# Patient Record
Sex: Female | Born: 1975 | Race: White | Hispanic: No | Marital: Married | Smoking: Never smoker
Health system: Southern US, Community
[De-identification: ages and names within clinical notes are randomized; demographics above are authoritative.]

## PROBLEM LIST (undated history)

## (undated) DIAGNOSIS — F1910 Other psychoactive substance abuse, uncomplicated: Secondary | ICD-10-CM

## (undated) DIAGNOSIS — F419 Anxiety disorder, unspecified: Secondary | ICD-10-CM

## (undated) DIAGNOSIS — F32A Depression, unspecified: Secondary | ICD-10-CM

## (undated) DIAGNOSIS — D649 Anemia, unspecified: Secondary | ICD-10-CM

## (undated) DIAGNOSIS — J45909 Unspecified asthma, uncomplicated: Secondary | ICD-10-CM

## (undated) DIAGNOSIS — L409 Psoriasis, unspecified: Secondary | ICD-10-CM

## (undated) HISTORY — DX: Unspecified asthma, uncomplicated: J45.909

## (undated) HISTORY — PX: OTHER SURGICAL HISTORY: SHX169

## (undated) HISTORY — DX: Psoriasis, unspecified: L40.9

## (undated) HISTORY — DX: Anemia, unspecified: D64.9

## (undated) HISTORY — DX: Depression, unspecified: F32.A

## (undated) HISTORY — DX: Anxiety disorder, unspecified: F41.9

## (undated) HISTORY — DX: Other psychoactive substance abuse, uncomplicated: F19.10

## (undated) HISTORY — PX: TONSILLECTOMY AND ADENOIDECTOMY: SHX28

---

## 2019-10-06 ENCOUNTER — Encounter: Payer: Self-pay | Admitting: Neurology

## 2019-11-17 ENCOUNTER — Ambulatory Visit: Payer: Commercial Managed Care - PPO | Admitting: Physician Assistant

## 2019-11-17 ENCOUNTER — Encounter (INDEPENDENT_AMBULATORY_CARE_PROVIDER_SITE_OTHER): Payer: Self-pay

## 2019-11-17 ENCOUNTER — Encounter: Payer: Self-pay | Admitting: Physician Assistant

## 2019-11-17 ENCOUNTER — Other Ambulatory Visit: Payer: Self-pay

## 2019-11-17 DIAGNOSIS — L4050 Arthropathic psoriasis, unspecified: Secondary | ICD-10-CM | POA: Diagnosis not present

## 2019-11-17 DIAGNOSIS — L409 Psoriasis, unspecified: Secondary | ICD-10-CM | POA: Diagnosis not present

## 2019-11-17 MED ORDER — CLOBETASOL PROPIONATE 0.05 % EX OINT: 1.0000 "application " | TOPICAL_OINTMENT | Freq: Two times a day (BID) | CUTANEOUS | 2 refills | Status: DC

## 2019-11-17 MED ORDER — ALCLOMETASONE DIPROPIONATE 0.05 % EX CREA: TOPICAL_CREAM | Freq: Two times a day (BID) | CUTANEOUS | 3 refills | Status: DC | PRN

## 2019-11-17 MED ORDER — CLOBETASOL PROPIONATE 0.05 % EX SOLN: 1.0000 "application " | Freq: Two times a day (BID) | CUTANEOUS | 3 refills | Status: DC

## 2019-11-17 NOTE — Progress Notes (Signed)
   New Patient   Subjective  Mackenzie Mccall is a 44 y.o. female who presents for the following: Psoriasis (All over scalp, arms, legs, private areas butt crack and top of pubic area. Tx steroid cream but doesnt remember names. Also has severe joint pain with it. ).   The following portions of the chart were reviewed this encounter and updated as appropriate: Tobacco  Allergies  Meds  Problems  Med Hx  Surg Hx  Fam Hx      Objective  Well appearing patient in no apparent distress; mood and affect are within normal limits.  A full examination was performed including scalp, head, eyes, ears, nose, lips, neck, chest, axillae, abdomen, back, buttocks, bilateral upper extremities, bilateral lower extremities, hands, feet, fingers, toes, fingernails, and toenails. All findings within normal limits unless otherwise noted below.  Objective  Head - Anterior (Face), Left Elbow - Posterior, Left Forearm - Posterior, Left Hand - Posterior, Left Knee - Anterior, Left Lower Leg - Anterior (3), Pubic, Right Elbow - Posterior, Right Forearm - Posterior, Right Hand - Posterior, Right Lower Leg - Anterior (4), Right Thigh - Anterior, Right Wrist - Posterior: Well-marginated erythematous papules/plaques with silvery scale. Thick on knees. Had it on her labia in the past but it is clear now. Fingernails and toenails involved yellow discoloration and brittle.  Images          Objective  Left 3rd Dorsal Mid Toe, Left Ankle - Anterior, Left Hand - Posterior, Left Knee - Anterior, Left Lower Leg - Posterior, Left Parotid Area, Left Superior Helix, Right 3rd Proximal Dorsal Toe, Right Ankle - Anterior, Right Ankle - Posterior, Right Hand - Posterior, Right Knee - Anterior, Right Parietal Scalp: Joint pain for 22 years. No tenosynovitis. No dactylitis.  Assessment & Plan  Psoriasis (18) Head - Anterior (Face); Left Elbow - Posterior; Right Elbow - Posterior; Right Wrist - Posterior; Left Hand -  Posterior; Right Hand - Posterior; Left Knee - Anterior; Left Forearm - Posterior; Right Forearm - Posterior; Right Thigh - Anterior; Left Lower Leg - Anterior (3); Right Lower Leg - Anterior (4); Pubic  Start approval for Talz  alclomethasone (ACLOVATE) 0.05 % cream - Head - Anterior (Face)  clobetasol ointment (TEMOVATE) 0.05 % - Left Elbow - Posterior, Left Forearm - Posterior, Left Hand - Posterior, Left Knee - Anterior, Left Lower Leg - Anterior (3), Pubic, Right Elbow - Posterior, Right Forearm - Posterior, Right Hand - Posterior, Right Lower Leg - Anterior (4), Right Thigh - Anterior, Right Wrist - Posterior  Other Related Procedures Comprehensive metabolic panel CBC with Differential/Platelet Hepatitis B surface antibody,quantitative Hepatitis B surface antigen Hepatitis B core antibody, total Hepatitis C antibody QuantiFERON-TB Gold Plus ANA  Ordered Medications: clobetasol (TEMOVATE) 0.05 % external solution  Psoriatic arthritis (HCC) (13) Left Hand - Posterior; Right Hand - Posterior; Left Knee - Anterior; Right Knee - Anterior; Left Ankle - Anterior; Right Ankle - Anterior; Right Ankle - Posterior; Left Lower Leg - Posterior; Left Superior Helix; Right 3rd Proximal Dorsal Toe; Left 3rd Dorsal Mid Toe; Right Parietal Scalp; Left Parotid Area  Other Related Procedures Comprehensive metabolic panel CBC with Differential/Platelet Hepatitis B surface antibody,quantitative Hepatitis B surface antigen Hepatitis B core antibody, total Hepatitis C antibody QuantiFERON-TB Gold Plus ANA

## 2019-11-24 ENCOUNTER — Telehealth: Payer: Self-pay | Admitting: *Deleted

## 2019-11-24 NOTE — Telephone Encounter (Signed)
Faxed over [name] paper work to [employer] at [phone] and to taltz together at [phone].

## 2019-11-25 LAB — CBC WITH DIFFERENTIAL/PLATELET
Absolute Monocytes: 270 cells/uL (ref 200–950)
Basophils Absolute: 42 cells/uL (ref 0–200)
Basophils Relative: 0.8 %
Eosinophils Absolute: 250 cells/uL (ref 15–500)
Eosinophils Relative: 4.8 %
HCT: 32 % — ABNORMAL LOW (ref 35.0–45.0)
Hemoglobin: 10.3 g/dL — ABNORMAL LOW (ref 11.7–15.5)
Lymphs Abs: 1440 cells/uL (ref 850–3900)
MCH: 28.8 pg (ref 27.0–33.0)
MCHC: 32.2 g/dL (ref 32.0–36.0)
MCV: 89.4 fL (ref 80.0–100.0)
MPV: 11.1 fL (ref 7.5–12.5)
Monocytes Relative: 5.2 %
Neutro Abs: 3198 cells/uL (ref 1500–7800)
Neutrophils Relative %: 61.5 %
Platelets: 215 10*3/uL (ref 140–400)
RBC: 3.58 10*6/uL — ABNORMAL LOW (ref 3.80–5.10)
RDW: 14 % (ref 11.0–15.0)
Total Lymphocyte: 27.7 %
WBC: 5.2 10*3/uL (ref 3.8–10.8)

## 2019-11-25 LAB — ANA: Anti Nuclear Antibody (ANA): NEGATIVE

## 2019-11-25 LAB — QUANTIFERON-TB GOLD PLUS
Mitogen-NIL: >10 IU/mL
NIL: 0.02 IU/mL
QuantiFERON-TB Gold Plus: NEGATIVE
TB1-NIL: 0 IU/mL
TB2-NIL: 0 IU/mL

## 2019-11-25 LAB — COMPREHENSIVE METABOLIC PANEL
AG Ratio: 1.9 (calc) (ref 1.0–2.5)
ALT: 17 U/L (ref 6–29)
AST: 15 U/L (ref 10–30)
Albumin: 4.3 g/dL (ref 3.6–5.1)
Alkaline phosphatase (APISO): 78 U/L (ref 31–125)
BUN: 20 mg/dL (ref 7–25)
CO2: 24 mmol/L (ref 20–32)
Calcium: 9.1 mg/dL (ref 8.6–10.2)
Chloride: 106 mmol/L (ref 98–110)
Creat: 0.57 mg/dL (ref 0.50–1.10)
Globulin: 2.3 g/dL (calc) (ref 1.9–3.7)
Glucose, Bld: 93 mg/dL (ref 65–99)
Potassium: 4.4 mmol/L (ref 3.5–5.3)
Sodium: 140 mmol/L (ref 135–146)
Total Bilirubin: 0.4 mg/dL (ref 0.2–1.2)
Total Protein: 6.6 g/dL (ref 6.1–8.1)

## 2019-11-25 LAB — HEPATITIS B SURFACE ANTIBODY, QUANTITATIVE: Hep B S AB Quant (Post): 26 m[IU]/mL (ref >=10)

## 2019-11-25 LAB — HEPATITIS C ANTIBODY
Hepatitis C Ab: NONREACTIVE
SIGNAL TO CUT-OFF: 0.01 (ref <1.00)

## 2019-11-25 LAB — HEPATITIS B CORE ANTIBODY, TOTAL: Hep B Core Total Ab: NONREACTIVE

## 2019-11-25 LAB — HEPATITIS B SURFACE ANTIGEN: Hepatitis B Surface Ag: NONREACTIVE

## 2019-11-30 ENCOUNTER — Telehealth: Payer: Self-pay | Admitting: Physician Assistant

## 2019-11-30 NOTE — Telephone Encounter (Signed)
Blood work results

## 2019-11-30 NOTE — Telephone Encounter (Signed)
Labs to patient told her we are working on getting her biologic taltz approved. Will be in tough when we hear something.

## 2019-12-03 ENCOUNTER — Telehealth: Payer: Self-pay | Admitting: Physician Assistant

## 2019-12-03 NOTE — Telephone Encounter (Signed)
Left message for patient to call us back.  

## 2019-12-03 NOTE — Telephone Encounter (Signed)
Patient left message on office voice mail saying that she had just received a denial on Taltz and wants to know what the next step is.   (Epic)

## 2019-12-07 ENCOUNTER — Other Ambulatory Visit: Payer: Self-pay | Admitting: Physician Assistant

## 2019-12-07 NOTE — Telephone Encounter (Signed)
Message left for patient, she  don't need to do anything we are filing a appeal.

## 2019-12-07 NOTE — Telephone Encounter (Signed)
Patient left message on office voice mail saying that she was calling again about the denial she received concerning prescription.  Patient wants to know what she needs to do to get prescription filled.

## 2019-12-08 NOTE — Telephone Encounter (Signed)
° ° °  Patient aware due to insurance medication will be changed to Cimzia per [facility]. [facility] portal updated new rx sent

## 2019-12-10 ENCOUNTER — Telehealth: Payer: Self-pay | Admitting: Physician Assistant

## 2019-12-10 NOTE — Telephone Encounter (Signed)
Mackenzie Mccall with [name] left message on office voice mail saying that she needed to speak with someone concerning trhe enrollment form that they received on [employer].  The program number that needs to be referenced is [id number].  Mackenzie Mccall said she needed to speak with someone as soon as possible.   (Epic)

## 2019-12-10 NOTE — Telephone Encounter (Signed)
PATIENT DINED TALTZ BY INSURANCE NOW ON CIMZIA

## 2019-12-11 MED ORDER — CIMZIA PREFILLED 2 X 200 MG/ML ~~LOC~~ KIT
400.0000 mg | PACK | SUBCUTANEOUS | 3 refills | Status: DC
Start: 2019-12-11 — End: 2019-12-14

## 2019-12-11 NOTE — Addendum Note (Signed)
Addended by: Johnna Acosta on: 12/11/2019 01:18 PM   Modules accepted: Orders

## 2019-12-11 NOTE — Addendum Note (Signed)
Addended by: Johnna Acosta on: 12/11/2019 12:12 PM   Modules accepted: Orders

## 2019-12-11 NOTE — Telephone Encounter (Signed)
   This was done in epic

## 2019-12-14 MED ORDER — CIMZIA PREFILLED 2 X 200 MG/ML ~~LOC~~ KIT: 400.0000 mg | PACK | SUBCUTANEOUS | 3 refills | Status: DC

## 2019-12-14 NOTE — Addendum Note (Signed)
Addended by: Johnna Acosta on: 12/14/2019 11:59 AM   Modules accepted: Orders

## 2019-12-14 NOTE — Telephone Encounter (Signed)
[facility] needed maintenance dose submitted

## 2019-12-23 ENCOUNTER — Telehealth: Payer: Self-pay | Admitting: *Deleted

## 2019-12-23 NOTE — Telephone Encounter (Signed)
According to [facility] patient already got delivery of Cimzia: I left message with patient to confirm she has received and is taking Cimzia

## 2019-12-23 NOTE — Telephone Encounter (Signed)
Patient called back and confirmed she has received Cimzia but is afraid to give to self because she hasn't been shown.  Encouraged patient to make nurse to see visit and we will teach her how to administer.  Nurse visit made tomorrow at 11 oclock.

## 2019-12-24 ENCOUNTER — Other Ambulatory Visit: Payer: Self-pay

## 2019-12-24 ENCOUNTER — Ambulatory Visit (INDEPENDENT_AMBULATORY_CARE_PROVIDER_SITE_OTHER): Payer: Commercial Managed Care - PPO | Admitting: *Deleted

## 2019-12-24 DIAGNOSIS — L409 Psoriasis, unspecified: Secondary | ICD-10-CM

## 2019-12-24 MED ORDER — CERTOLIZUMAB PEGOL 2 X 200 MG/ML ~~LOC~~ KIT
400.0000 mg | PACK | Freq: Once | SUBCUTANEOUS | Status: AC
  Administered 2019-12-24: 400 mg via SUBCUTANEOUS

## 2019-12-28 NOTE — Progress Notes (Signed)
NEUROLOGY CONSULTATION NOTE  Mackenzie Mccall MRN: [id number] DOB: Aug 10, 1975  Referring provider: Donald Prose, MD Primary care provider: Donald Prose, MD  Reason for consult:  Possible transient ischemic attack  HISTORY OF PRESENT ILLNESS: Mackenzie Mccall is a 44 year old right-handed female with psoriasis who presents for possible TIA.  History supplemented by referring provider's note.  In October, she had an episode where she developed bilateral blurred vision and weakness of her right arm and leg.  She had to drag herself upstairs and then fell.  She went to the bathroom to splash water on her face and then she felt lightheaded and passed out, but only for a few seconds.  Her husband came into the bathroom.  She had not bit her tongue or had incontinence.  She looked at herself in the mirror and noted that her face was pale and lips blue.  She washed her face and felt better.  She felt well that day and was not dehydrated or missed meals.  For the next couple of weeks, she felt some vague weakness in the right lower extremity and some word-finding difficulty.  Since then, she still has very mild word-finding difficulty and feels like she is in a fog.  She did not seek medical attention at the time.  She did not have COVID.  No prior history and no subsequent spells.  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  Her mother has history of epilepsy.  No family history of stroke at young age.    Mom epilepsy Seizure codeine after operation.    PAST MEDICAL HISTORY: Past Medical History:  Diagnosis Date  . Psoriasis     PAST SURGICAL HISTORY: No past surgical history on file.  MEDICATIONS: Current Outpatient Medications on File Prior to Visit  Medication Sig Dispense Refill  . alclomethasone (ACLOVATE) 0.05 % cream Apply topically 2 (two) times daily as needed (Rash). Apply to face as directed. 180 g 3  . Certolizumab Pegol (CIMZIA PREFILLED) 2 X 200 MG/ML KIT Inject 400 mg  into the skin every 14 (fourteen) days. 2 kit 3  . clobetasol (TEMOVATE) 0.05 % external solution Apply 1 application topically 2 (two) times daily. 50 mL 3  . clobetasol ointment (TEMOVATE) 5.62 % Apply 1 application topically 2 (two) times daily. 60 g 2  . Multiple Vitamin (MULTIVITAMIN) tablet Take 1 tablet by mouth daily.     No current facility-administered medications on file prior to visit.    ALLERGIES: Allergies  Allergen Reactions  . Codeine   . Latex   . Penicillins     FAMILY HISTORY: History reviewed. No pertinent family history.  SOCIAL HISTORY: Social History   Socioeconomic History  . Marital status: Married    Spouse name: Not on file  . Number of children: Not on file  . Years of education: Not on file  . Highest education level: Not on file  Occupational History  . Not on file  Tobacco Use  . Smoking status: Never Smoker  . Smokeless tobacco: Never Used  Substance and Sexual Activity  . Alcohol use: Yes  . Drug use: Never  . Sexual activity: Not on file  Other Topics Concern  . Not on file  Social History Narrative  . Not on file   Social Determinants of Health   Financial Resource Strain:   . Difficulty of Paying Living Expenses: Not on file  Food Insecurity:   . Worried About Charity fundraiser  in the Last Year: Not on file  . Ran Out of Food in the Last Year: Not on file  Transportation Needs:   . Lack of Transportation (Medical): Not on file  . Lack of Transportation (Non-Medical): Not on file  Physical Activity:   . Days of Exercise per Week: Not on file  . Minutes of Exercise per Session: Not on file  Stress:   . Feeling of Stress : Not on file  Social Connections:   . Frequency of Communication with Friends and Family: Not on file  . Frequency of Social Gatherings with Friends and Family: Not on file  . Attends Religious Services: Not on file  . Active Member of Clubs or Organizations: Not on file  . Attends Archivist  Meetings: Not on file  . Marital Status: Not on file  Intimate Partner Violence:   . Fear of Current or Ex-Partner: Not on file  . Emotionally Abused: Not on file  . Physically Abused: Not on file  . Sexually Abused: Not on file    PHYSICAL EXAM: Blood pressure 134/78, pulse 67, height '5\' 5"'  (1.651 m), weight 217 lb (98.4 kg), SpO2 97 %. General: No acute distress.  Patient appears well-groomed.  Head:  Normocephalic/atraumatic Eyes:  fundi examined but not visualized Neck: supple, no paraspinal tenderness, full range of motion Back: No paraspinal tenderness Heart: regular rate and rhythm Lungs: Clear to auscultation bilaterally. Vascular: No carotid bruits. Neurological Exam: Mental status: alert and oriented to person, place, and time, recent and remote memory intact, fund of knowledge intact, attention and concentration intact, speech fluent and not dysarthric, language intact. Cranial nerves: CN I: not tested CN II: pupils equal, round and reactive to light, visual fields intact CN III, IV, VI:  full range of motion, no nystagmus, no ptosis CN V: facial sensation intact CN VII: upper and lower face symmetric CN VIII: hearing intact CN IX, X: gag intact, uvula midline CN XI: sternocleidomastoid and trapezius muscles intact CN XII: tongue midline Bulk & Tone: normal, no fasciculations. Motor:  5/5 throughout  Sensation:  Pinprick and vibration sensation intact. Deep Tendon Reflexes:  2+ throughout, toes downgoing.  Finger to nose testing:  Without dysmetria.  Heel to shin:  Without dysmetria.  Gait:  Normal station and stride. Romberg negative.  IMPRESSION: Transient episode of syncope, blurred vision, right sided weakness, word-finding difficulty.  Etiology unclear.  She does not have stroke risk factors but would evaluate for possible vertebrobasilar event.  Consider seizure as well.  PLAN: 1.  MRI and MRA of brain without contrast 2.  Routine EEG 3.  Further  recommendations pending results.  Thank you for allowing me to take part in the care of this patient.  Metta Clines, DO  CC: Donald Prose, MD

## 2019-12-29 ENCOUNTER — Other Ambulatory Visit: Payer: Self-pay

## 2019-12-29 ENCOUNTER — Ambulatory Visit: Payer: Commercial Managed Care - PPO | Admitting: Neurology

## 2019-12-29 ENCOUNTER — Encounter: Payer: Self-pay | Admitting: Neurology

## 2019-12-29 VITALS — BP 134/78 | HR 67 | Ht 65.0 in | Wt 217.0 lb

## 2019-12-29 DIAGNOSIS — H538 Other visual disturbances: Secondary | ICD-10-CM

## 2019-12-29 DIAGNOSIS — R55 Syncope and collapse: Secondary | ICD-10-CM | POA: Diagnosis not present

## 2019-12-29 DIAGNOSIS — R4789 Other speech disturbances: Secondary | ICD-10-CM

## 2019-12-29 DIAGNOSIS — R531 Weakness: Secondary | ICD-10-CM

## 2019-12-29 NOTE — Patient Instructions (Addendum)
1.  Check MRI of brain without contrast 2.  Check MRA of head 3.  Routine EEG 4.  Further recommendations pending results

## 2020-01-13 ENCOUNTER — Other Ambulatory Visit: Payer: Self-pay

## 2020-01-13 ENCOUNTER — Ambulatory Visit (INDEPENDENT_AMBULATORY_CARE_PROVIDER_SITE_OTHER): Payer: Commercial Managed Care - PPO | Admitting: Neurology

## 2020-01-13 DIAGNOSIS — R55 Syncope and collapse: Secondary | ICD-10-CM

## 2020-01-13 NOTE — Procedures (Signed)
ELECTROENCEPHALOGRAM REPORT  Date of Study: 0915/2021  Patient's Name: Mackenzie Mccall MRN: [id number] Date of Birth: [birth year]  Clinical History: 44 year old female with transient episode of syncope, blurred vision, right sided weakness, word-finding difficulty.   Medications: ACLOVATE 0.05 % cream CIMZIA PREFILLED 2 X 200 MG/ML KIT TEMOVATE 0.05 % external solution MULTIVITAMIN tablet  Technical Summary: A multichannel digital EEG recording measured by the international 10-20 system with electrodes applied with paste and impedances below 5000 ohms performed in our laboratory with EKG monitoring in an awake and drowsy patient.  Hyperventilation was not performed as patient was wearing a face mask due to the COVID-19 pandemic.  Photic stimulation w performed.  The digital EEG was referentially recorded, reformatted, and digitally filtered in a variety of bipolar and referential montages for optimal display.    Description: The patient is awake and drowsy during the recording.  During maximal wakefulness, there is a symmetric, medium voltage 10 Hz posterior dominant rhythm that attenuates with eye opening.  The record is symmetric.  Stage 2 sleep was not seen.  Photic stimulation did not elicit any abnormalities.  There were no epileptiform discharges or electrographic seizures seen.    EKG lead was unremarkable.  Impression: This awake and drowsy EEG is normal.    Clinical Correlation: A normal EEG does not exclude a clinical diagnosis of epilepsy.  If further clinical questions remain, prolonged EEG may be helpful.  Clinical correlation is advised.   Metta Clines, DO

## 2020-01-23 ENCOUNTER — Ambulatory Visit
Admission: RE | Admit: 2020-01-23 | Discharge: 2020-01-23 | Disposition: A | Discharge status: Home / Self Care | Payer: Commercial Managed Care - PPO | Source: Ambulatory Visit | Attending: Neurology | Admitting: Neurology

## 2020-01-23 DIAGNOSIS — R531 Weakness: Secondary | ICD-10-CM

## 2020-01-23 IMAGING — MR MR HEAD W/O CM
10 series · 48 of 48 positions shown · non-contrast
Comparison: None available.

CLINICAL DATA: Follow-up examination for stroke, acute right-sided
weakness, episode of visual loss.

EXAM:
MRI HEAD WITHOUT CONTRAST
MRA HEAD WITHOUT CONTRAST
TECHNIQUE: Multiplanar, multiecho pulse sequences of the brain and surrounding
structures were obtained without intravenous contrast. Angiographic
images of the head were obtained using MRA technique without
contrast.

[Series 1: T1 · sagittal · 4.0mm · 0.75mm/px · 2 of 31 slices shown (1 of 2)]
[im 1/31]
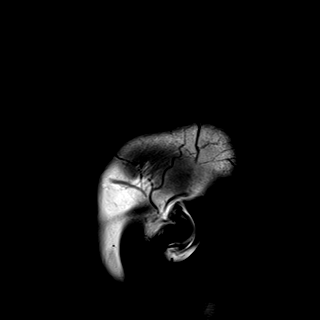
[im 31/31]
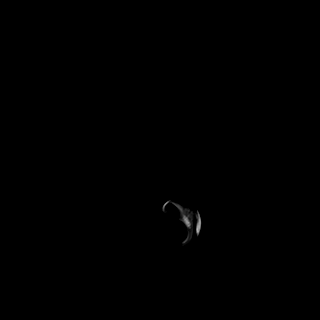

[Series 2: DWI · axial · 3.0mm · 1.44mm/px · z∈[-102,+42]mm · 7 of 90 slices shown (1 of 4)]
[im 1/90]
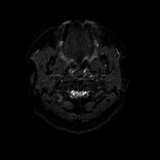
[im 15/90]
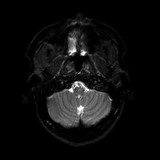
[im 30/90]
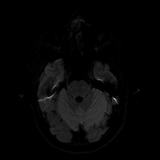
[im 45/90]
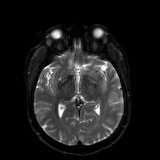
[im 60/90]
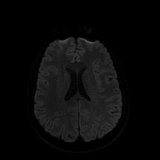
[im 75/90]
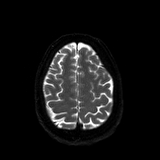
[im 90/90]
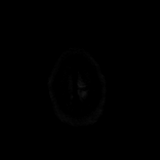

[Series 3: DWI · axial · 3.0mm · 1.44mm/px · z∈[-102,+42]mm · 4 of 45 slices shown (2 of 4)]
[im 1/45]
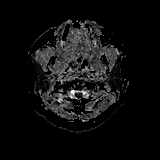
[im 15/45]
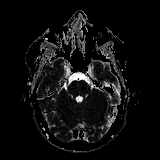
[im 30/45]
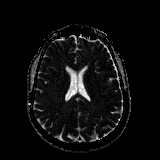
[im 45/45]
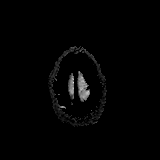

[Series 4: DWI · coronal · 5.0mm · 1.44mm/px · 5 of 60 slices shown (3 of 4)]
[im 1/60]
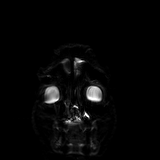
[im 15/60]
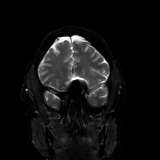
[im 30/60]
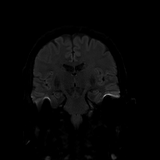
[im 45/60]
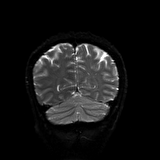
[im 60/60]
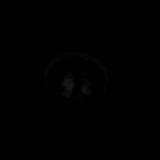

[Series 5: DWI · coronal · 5.0mm · 1.44mm/px · 2 of 30 slices shown (4 of 4)]
[im 1/30]
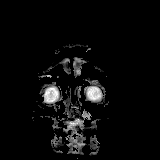
[im 30/30]
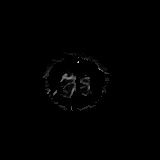

[Series 6: T2 · axial · 4.0mm · 0.36mm/px · z∈[-101,+44]mm · 2 of 29 slices shown (1 of 2)]
[im 1/29]
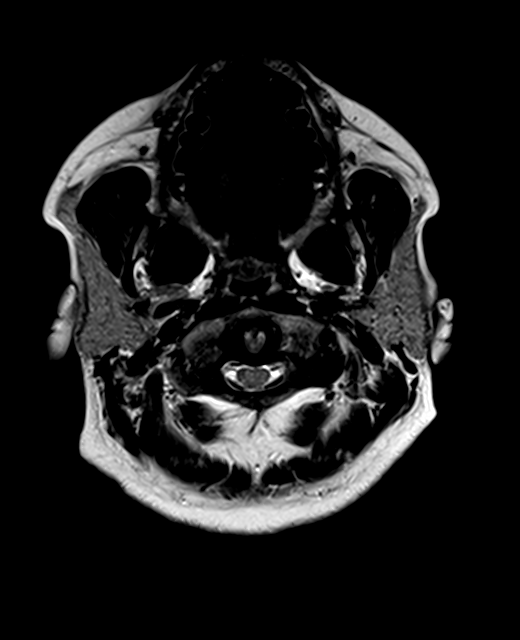
[im 29/29]
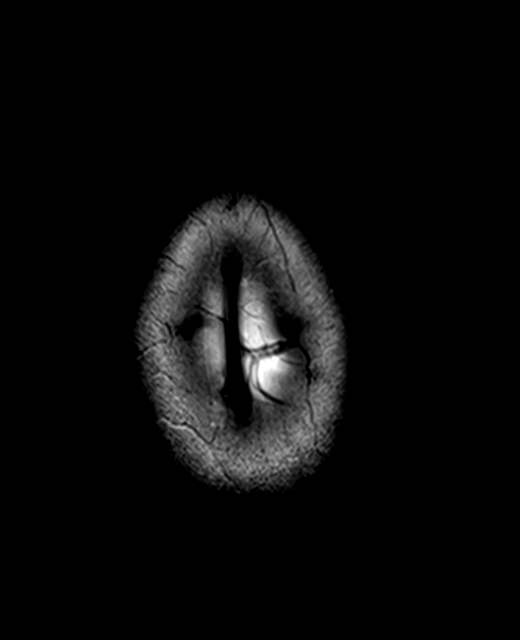

[Series 7: FLAIR · axial · 3.0mm · 0.72mm/px · z∈[-105,+44]mm · 2 of 26 slices shown]
[im 1/26]
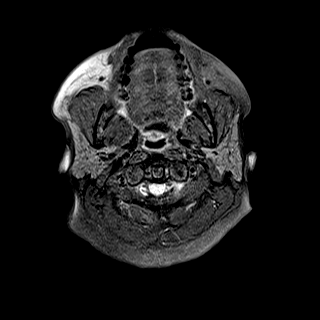
[im 26/26]
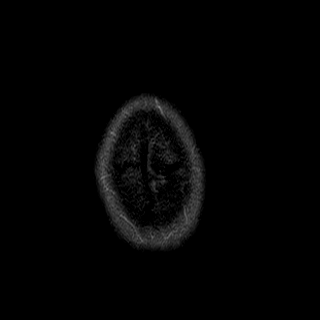

[Series 9: swi_images · axial · 1.5mm · 0.90mm/px · z∈[-99,+42]mm · 8 of 96 slices shown]
[im 1/96]
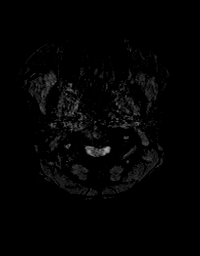
[im 14/96]
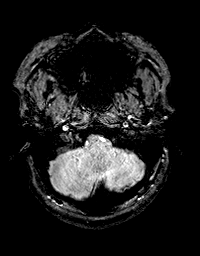
[im 28/96]
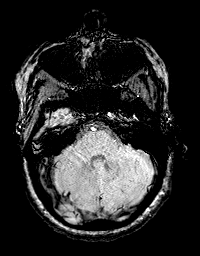
[im 41/96]
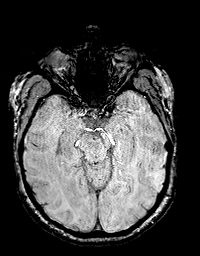
[im 55/96]
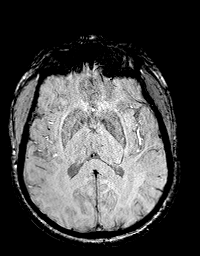
[im 68/96]
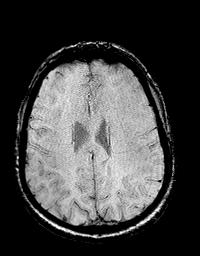
[im 82/96]
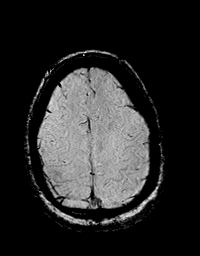
[im 96/96]
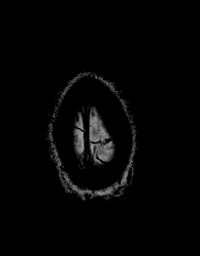

[Series 10: T1 · axial · 1.0mm · 0.94mm/px · z∈[-108,+50]mm · 13 of 160 slices shown (2 of 2)]
[im 1/160]
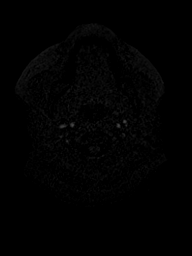
[im 14/160]
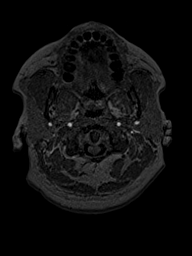
[im 27/160]
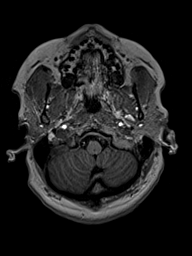
[im 40/160]
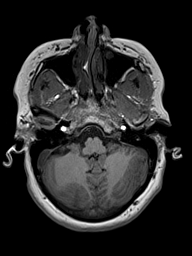
[im 54/160]
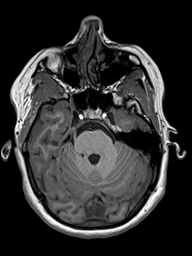
[im 67/160]
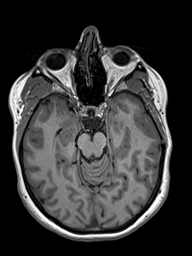
[im 80/160]
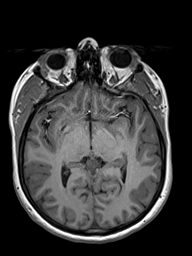
[im 93/160]
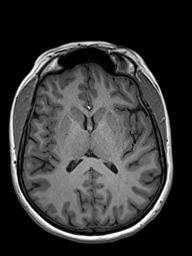
[im 107/160]
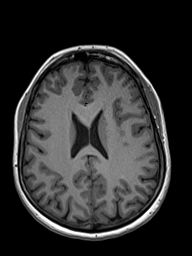
[im 120/160]
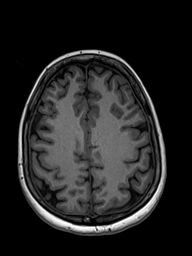
[im 133/160]
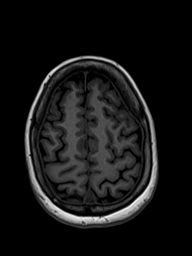
[im 146/160]
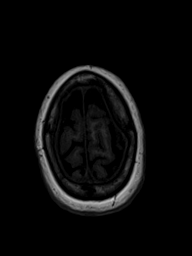
[im 160/160]
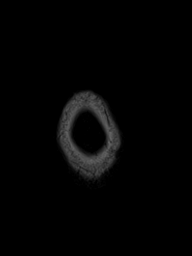

[Series 11: T2 · coronal · 4.0mm · 0.36mm/px · 3 of 35 slices shown (2 of 2)]
[im 1/35]
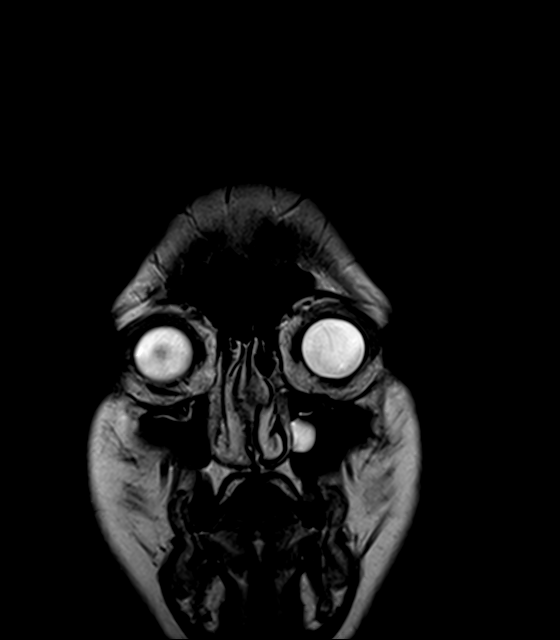
[im 18/35]
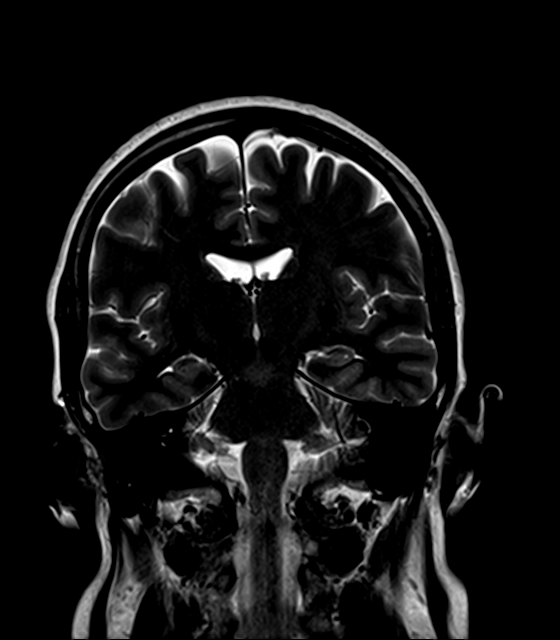
[im 35/35]
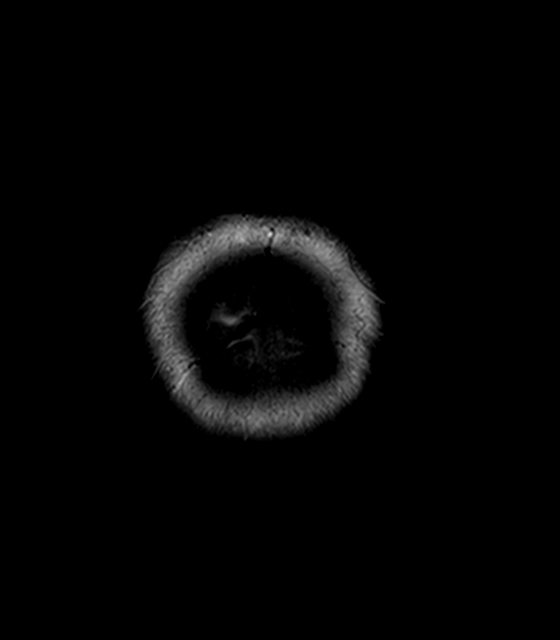

[48 of 48 positions shown; findings below may reference images not displayed]

FINDINGS: MRI HEAD FINDINGS

Brain: Cerebral volume within normal limits. No focal parenchymal
signal abnormality identified. No significant cerebral white matter
disease for age.

No abnormal foci of restricted diffusion to suggest acute or
subacute ischemia. Gray-white matter differentiation well
maintained. No encephalomalacia to suggest chronic or prior
infarction. No foci of susceptibility artifact to suggest acute or
chronic intracranial hemorrhage.

No mass lesion, midline shift or mass effect. She ventricles normal
size without hydrocephalus. No extra-axial fluid collection.
Pituitary gland suprasellar region within normal limits. Midline
structures intact.

Vascular: Major intracranial vascular flow voids are well
maintained.

Skull and upper cervical spine: Craniocervical junction within
normal limits. Visualized upper cervical spine normal. Bone marrow
signal intensity within normal limits. No scalp soft tissue
abnormality.

Sinuses/Orbits: Globes and orbital soft tissues within normal
limits. Small left maxillary sinus retention cyst noted. Paranasal
sinuses are otherwise largely clear. Left-to-right nasal septal
deviation noted without concha bullosa. No mastoid effusion. Inner
ear structures grossly normal.

Other: None.

MRA HEAD FINDINGS

ANTERIOR CIRCULATION:

Distal cervical segments of the internal carotid arteries are widely
patent with symmetric antegrade flow. Petrous, cavernous, and
supraclinoid ICAs widely patent without stenosis or other
abnormality. Origin of the ophthalmic arteries patent and normal.
ICA termini well perfused. A1 segments patent bilaterally. Left A1
slightly hypoplastic, accounting for the slightly diminutive left
ICA is compared to the right. Normal anterior communicating artery
complex. Strongly dominant right A2 segment noted. Both PCAs patent
to their distal aspects without stenosis. No M1 stenosis or
occlusion. Normal MCA bifurcations. Distal MCA branches well
perfused and symmetric.

POSTERIOR CIRCULATION:

Both vertebral arteries widely patent to the vertebrobasilar
junction without stenosis. Left vertebral artery dominant. Both
picas patent. Basilar widely patent to its distal aspect without
stenosis. Superior cerebral arteries patent bilaterally. Both PCAs
primarily supplied via the basilar well perfused to their distal
aspects.

No intracranial aneurysm or other vascular abnormality.
IMPRESSION: 1. Normal brain MRI for age. No acute, subacute, or chronic ischemic
changes identified. No other findings to explain patient's symptoms.
2. Normal intracranial MRA.

## 2020-01-23 IMAGING — MR MR MRA HEAD W/O CM
1 series · 10 of 48 positions shown · non-contrast
Comparison: None available.

CLINICAL DATA: Follow-up examination for stroke, acute right-sided
weakness, episode of visual loss.

EXAM:
MRI HEAD WITHOUT CONTRAST
MRA HEAD WITHOUT CONTRAST
TECHNIQUE: Multiplanar, multiecho pulse sequences of the brain and surrounding
structures were obtained without intravenous contrast. Angiographic
images of the head were obtained using MRA technique without
contrast.

[Series 5: [id number]-[id number] · axial · 0.6mm · 0.26mm/px · z∈[-61,+15]mm · 10 of 162 slices shown]
[im 11/162]
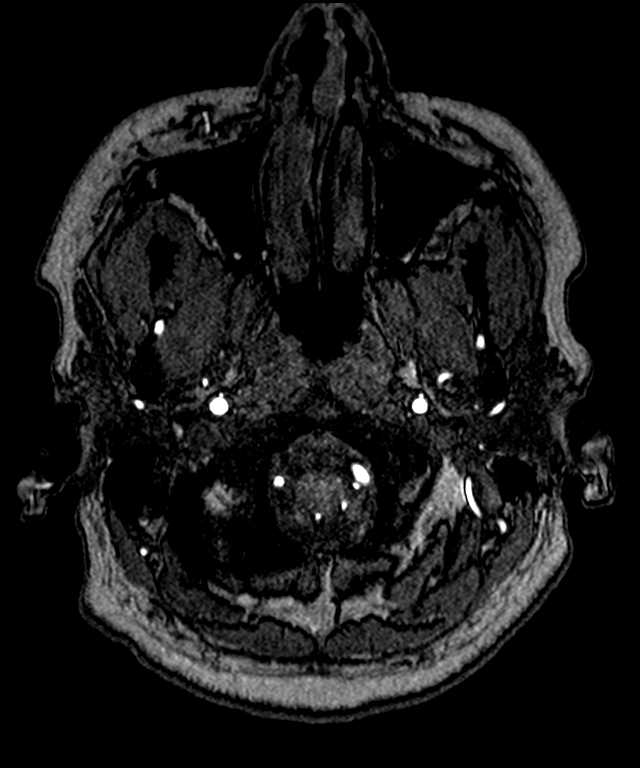
[im 28/162]
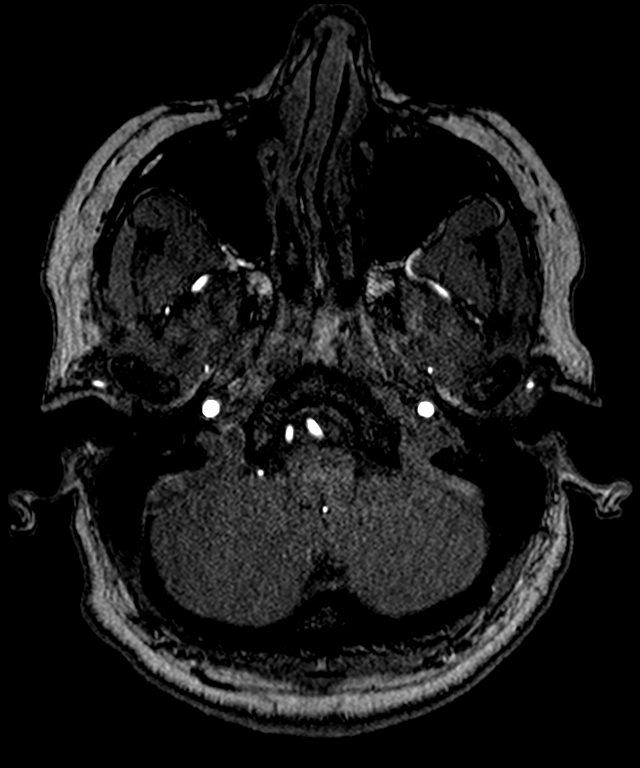
[im 31/162]
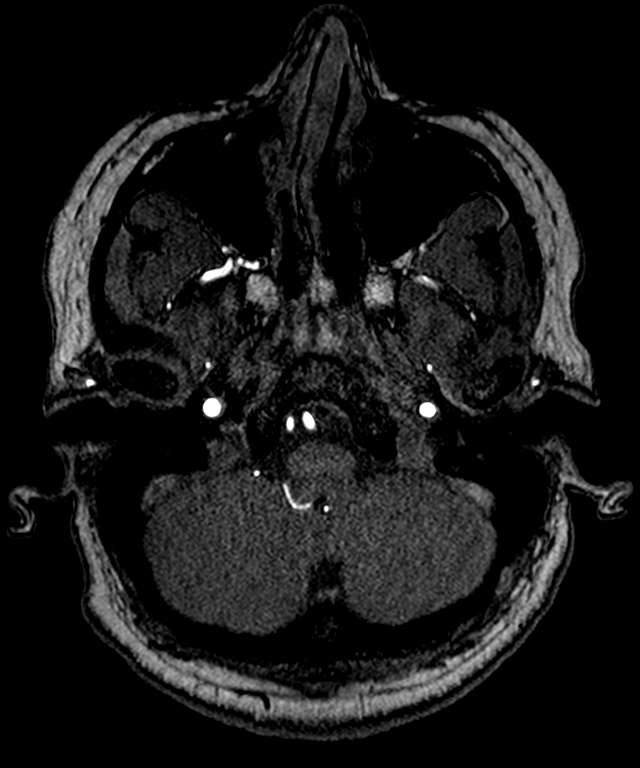
[im 52/162]
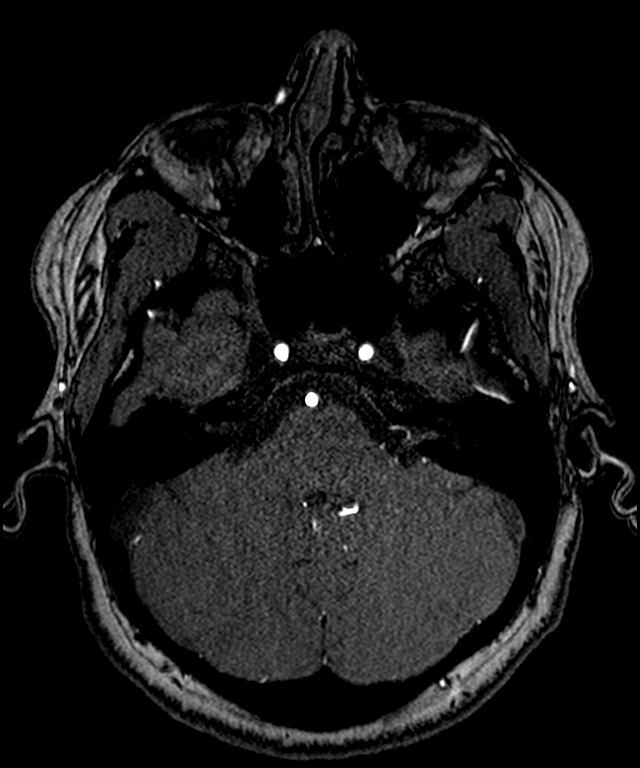
[im 72/162]
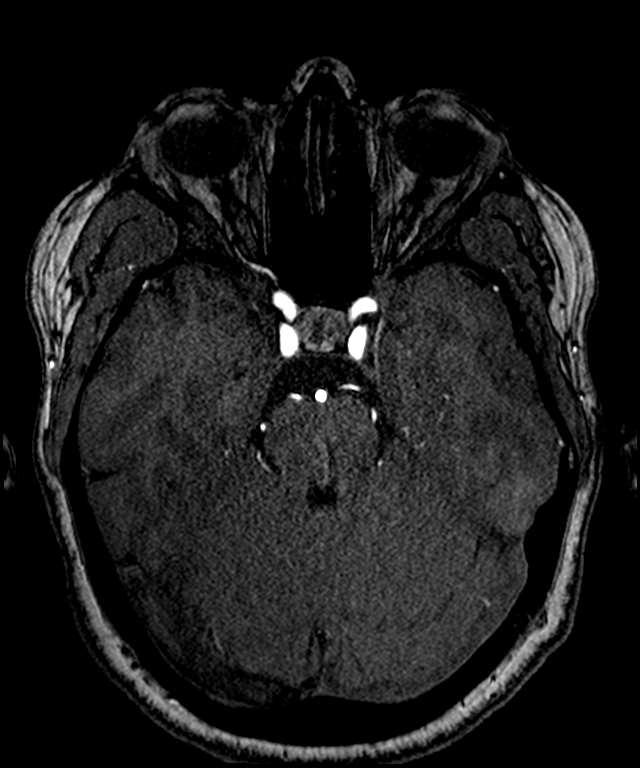
[im 83/162]
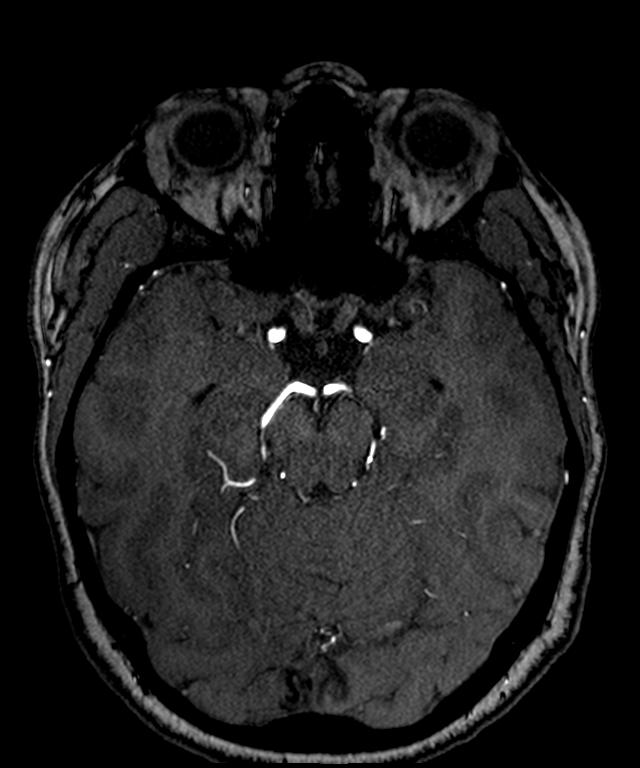
[im 93/162]
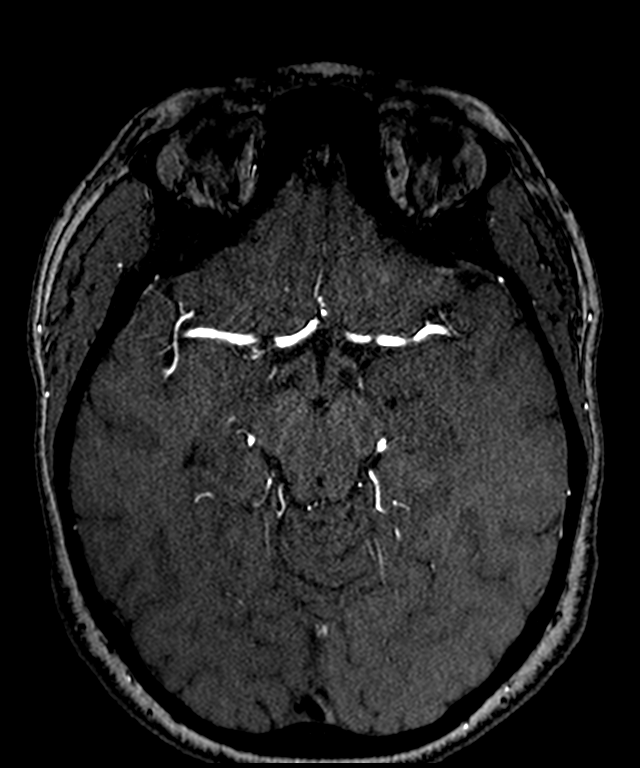
[im 114/162]
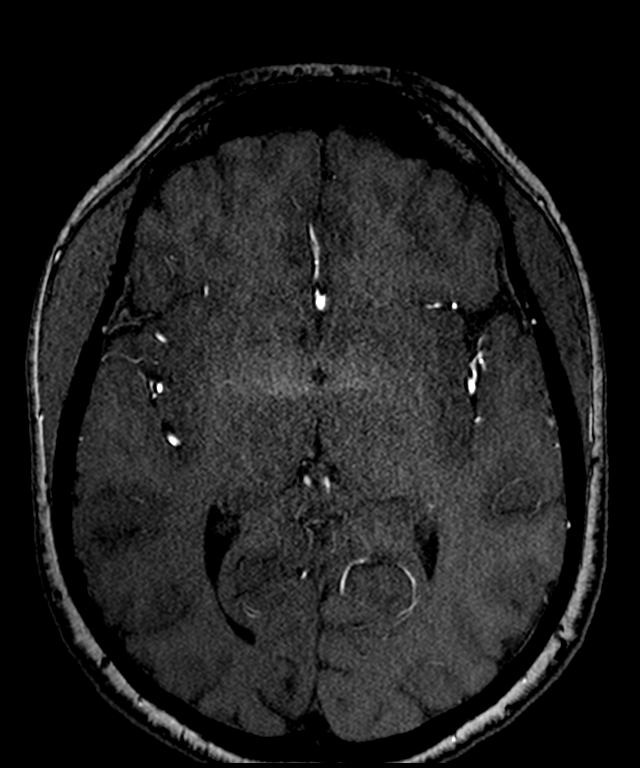
[im 134/162]
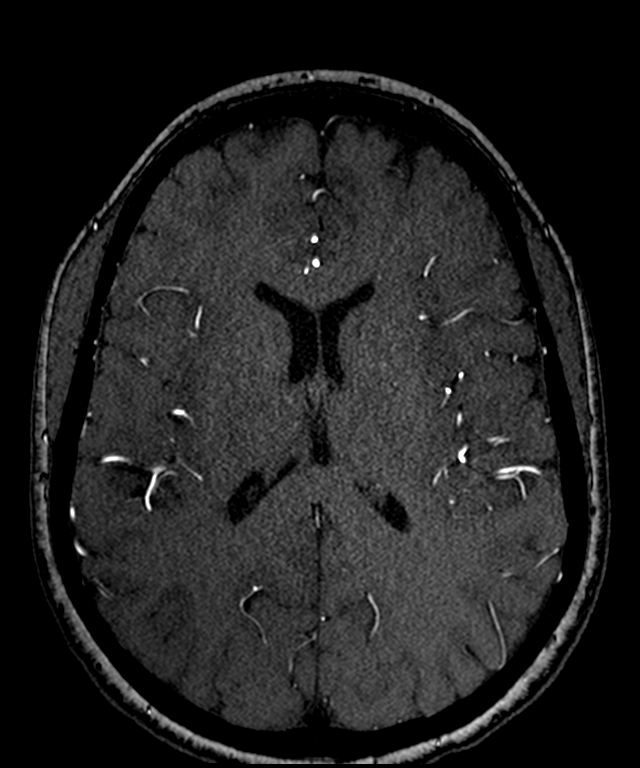
[im 138/162]
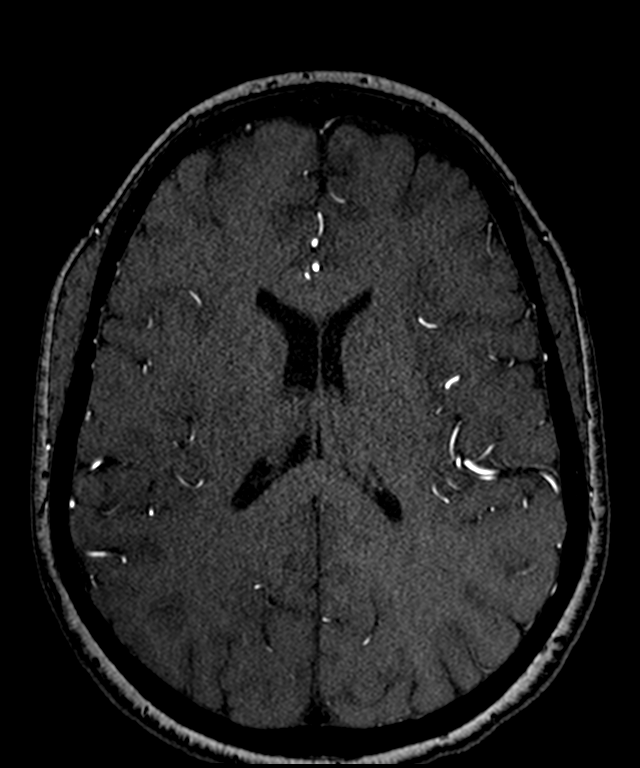

[10 of 48 positions shown; findings below may reference images not displayed]

FINDINGS: MRI HEAD FINDINGS

Brain: Cerebral volume within normal limits. No focal parenchymal
signal abnormality identified. No significant cerebral white matter
disease for age.

No abnormal foci of restricted diffusion to suggest acute or
subacute ischemia. Gray-white matter differentiation well
maintained. No encephalomalacia to suggest chronic or prior
infarction. No foci of susceptibility artifact to suggest acute or
chronic intracranial hemorrhage.

No mass lesion, midline shift or mass effect. She ventricles normal
size without hydrocephalus. No extra-axial fluid collection.
Pituitary gland suprasellar region within normal limits. Midline
structures intact.

Vascular: Major intracranial vascular flow voids are well
maintained.

Skull and upper cervical spine: Craniocervical junction within
normal limits. Visualized upper cervical spine normal. Bone marrow
signal intensity within normal limits. No scalp soft tissue
abnormality.

Sinuses/Orbits: Globes and orbital soft tissues within normal
limits. Small left maxillary sinus retention cyst noted. Paranasal
sinuses are otherwise largely clear. Left-to-right nasal septal
deviation noted without concha bullosa. No mastoid effusion. Inner
ear structures grossly normal.

Other: None.

MRA HEAD FINDINGS

ANTERIOR CIRCULATION:

Distal cervical segments of the internal carotid arteries are widely
patent with symmetric antegrade flow. Petrous, cavernous, and
supraclinoid ICAs widely patent without stenosis or other
abnormality. Origin of the ophthalmic arteries patent and normal.
ICA termini well perfused. A1 segments patent bilaterally. Left A1
slightly hypoplastic, accounting for the slightly diminutive left
ICA is compared to the right. Normal anterior communicating artery
complex. Strongly dominant right A2 segment noted. Both PCAs patent
to their distal aspects without stenosis. No M1 stenosis or
occlusion. Normal MCA bifurcations. Distal MCA branches well
perfused and symmetric.

POSTERIOR CIRCULATION:

Both vertebral arteries widely patent to the vertebrobasilar
junction without stenosis. Left vertebral artery dominant. Both
picas patent. Basilar widely patent to its distal aspect without
stenosis. Superior cerebral arteries patent bilaterally. Both PCAs
primarily supplied via the basilar well perfused to their distal
aspects.

No intracranial aneurysm or other vascular abnormality.
IMPRESSION: 1. Normal brain MRI for age. No acute, subacute, or chronic ischemic
changes identified. No other findings to explain patient's symptoms.
2. Normal intracranial MRA.

## 2020-01-25 ENCOUNTER — Telehealth: Payer: Self-pay

## 2020-01-25 NOTE — Telephone Encounter (Signed)
Patient called in stating she got a message about her MRI results.

## 2020-02-24 NOTE — Progress Notes (Signed)
Nurse to see for injection training

## 2020-03-18 ENCOUNTER — Ambulatory Visit: Payer: Commercial Managed Care - PPO | Admitting: Physician Assistant

## 2020-04-05 ENCOUNTER — Other Ambulatory Visit: Payer: Self-pay | Admitting: Physician Assistant

## 2020-04-30 HISTORY — PX: ORIF ANKLE FRACTURE: SUR919

## 2020-05-13 ENCOUNTER — Ambulatory Visit: Payer: Commercial Managed Care - PPO | Admitting: Physician Assistant

## 2020-10-04 ENCOUNTER — Ambulatory Visit: Payer: Commercial Managed Care - PPO | Admitting: Physician Assistant

## 2020-10-24 ENCOUNTER — Telehealth: Payer: Self-pay

## 2020-10-24 ENCOUNTER — Other Ambulatory Visit: Payer: Self-pay | Admitting: Dermatology

## 2020-10-24 NOTE — Telephone Encounter (Signed)
Error

## 2020-11-29 ENCOUNTER — Telehealth: Payer: Self-pay | Admitting: *Deleted

## 2020-11-29 NOTE — Telephone Encounter (Signed)
Received fax stating [facility] has made several attempts to contact patient to fill cimzia. Everything done on our end- patient responsibility to call for delivery.

## 2020-12-12 ENCOUNTER — Telehealth: Payer: Self-pay

## 2020-12-12 NOTE — Telephone Encounter (Addendum)
Fax received from patient's Pharmacy stating that the patient reported having surgery on her leg to repair 5 broken bones and the Surgeon asked her to stop her her Cimzia injections. Per [facility] the patient wants to know when to restart her Cimzia after surgery?  I spoke with Dr. Jorja Loa regarding when patient should restart Cimzia injections. Per Dr. Jorja Loa patient can restart 4 weeks post op and be cleared by surgeon.

## 2020-12-15 NOTE — Telephone Encounter (Signed)
Phone call to patient with Dr. Sherryl Barters recommendations. Voicemail left with Dr. Sherryl Barters recommendations.

## 2020-12-26 ENCOUNTER — Telehealth: Payer: Self-pay | Admitting: *Deleted

## 2020-12-26 ENCOUNTER — Other Ambulatory Visit: Payer: Self-pay | Admitting: *Deleted

## 2020-12-26 NOTE — Telephone Encounter (Signed)
Prior Authorization done via cover my meds for patients cimzia. Waiting on determination.

## 2021-04-11 ENCOUNTER — Other Ambulatory Visit: Payer: Self-pay

## 2021-04-11 ENCOUNTER — Encounter: Payer: Self-pay | Admitting: Physician Assistant

## 2021-04-11 ENCOUNTER — Ambulatory Visit: Payer: BLUE CROSS/BLUE SHIELD | Admitting: Physician Assistant

## 2021-04-11 DIAGNOSIS — Z79899 Other long term (current) drug therapy: Secondary | ICD-10-CM

## 2021-04-11 DIAGNOSIS — L409 Psoriasis, unspecified: Secondary | ICD-10-CM

## 2021-04-11 DIAGNOSIS — L400 Psoriasis vulgaris: Secondary | ICD-10-CM | POA: Diagnosis not present

## 2021-04-11 MED ORDER — CLOBETASOL PROPIONATE 0.05 % EX SOLN: 1.0000 "application " | Freq: Two times a day (BID) | CUTANEOUS | 3 refills | Status: DC

## 2021-04-11 MED ORDER — CLOBETASOL PROPIONATE 0.05 % EX OINT: 1.0000 "application " | TOPICAL_OINTMENT | Freq: Two times a day (BID) | CUTANEOUS | 2 refills | Status: DC

## 2021-04-11 MED ORDER — MUPIROCIN 2 % EX OINT: 1.0000 "application " | TOPICAL_OINTMENT | Freq: Two times a day (BID) | CUTANEOUS | 6 refills | Status: DC

## 2021-04-11 NOTE — Progress Notes (Signed)
° °  Follow-Up Visit   Subjective  Mackenzie Mccall is a 45 y.o. female who presents for the following: Psoriasis (Completley flared up- legs, scalp & behind ears- tx- Cimzia- last injections was sept 2021- did keep clear before prescription expired). She had a tib/fib fracture in June and was in so much pain that she couldn't get to the office for continuation of therapy. She is here today to restart Cimzia.    The following portions of the chart were reviewed this encounter and updated as appropriate:  Tobacco   Allergies   Meds   Problems   Med Hx   Surg Hx   Fam Hx       Objective  Well appearing patient in no apparent distress; mood and affect are within normal limits.  A full examination was performed including scalp, head, eyes, ears, nose, lips, neck, chest, axillae, abdomen, back, buttocks, bilateral upper extremities, bilateral lower extremities, hands, feet, fingers, toes, fingernails, and toenails. All findings within normal limits unless otherwise noted below.  Gluteal Crease, Left Dorsal Hand, Left Lower Leg - Anterior, Right Dorsal Hand, Right Lower Leg - Anterior, scalp and fingernails. Shins>hands and scalp- excoriated confluent plaques. No evidence of dactylitis or enlarged joints.   Assessment & Plan  Psoriasis vulgaris Left Lower Leg - Anterior; Right Lower Leg - Anterior; Gluteal Crease; Left Dorsal Hand; Right Dorsal Hand; scalp and fingernails.  If TB results are negative- patient will resume Rx Cimzia  QuantiFERON-TB Gold Plus - Gluteal Crease, Left Dorsal Hand, Left Lower Leg - Anterior, Right Dorsal Hand, Right Lower Leg - Anterior, scalp and fingernails.  clobetasol (TEMOVATE) 0.05 % external solution - Gluteal Crease, Left Dorsal Hand, Left Lower Leg - Anterior, Right Dorsal Hand, Right Lower Leg - Anterior, scalp and fingernails. Apply 1 application topically 2 (two) times daily.  clobetasol ointment (TEMOVATE) 0.05 % - Gluteal Crease, Left Dorsal Hand, Left Lower  Leg - Anterior, Right Dorsal Hand, Right Lower Leg - Anterior, scalp and fingernails. Apply 1 application topically 2 (two) times daily.  mupirocin ointment (BACTROBAN) 2 % - Gluteal Crease, Left Dorsal Hand, Left Lower Leg - Anterior, Right Dorsal Hand, Right Lower Leg - Anterior, scalp and fingernails. Apply 1 application topically 2 (two) times daily.  Psoriasis  Related Medications alclomethasone (ACLOVATE) 0.05 % cream Apply topically 2 (two) times daily as needed (Rash). Apply to face as directed.  Encounter for long-term (current) use of medications  Related Procedures QuantiFERON-TB Gold Plus  Related Medications clobetasol (TEMOVATE) 0.05 % external solution Apply 1 application topically 2 (two) times daily.  clobetasol ointment (TEMOVATE) 0.05 % Apply 1 application topically 2 (two) times daily.  mupirocin ointment (BACTROBAN) 2 % Apply 1 application topically 2 (two) times daily.   I, Jahliyah Trice, PA-C, have reviewed all documentation's for this visit.  The documentation on 04/11/21 for the exam, diagnosis, procedures and orders are all accurate and complete.

## 2021-04-13 LAB — QUANTIFERON-TB GOLD PLUS
Mitogen-NIL: >10 IU/mL
NIL: 0.05 IU/mL
QuantiFERON-TB Gold Plus: NEGATIVE
TB1-NIL: 0 IU/mL
TB2-NIL: 0 IU/mL

## 2021-05-08 ENCOUNTER — Telehealth: Payer: Self-pay | Admitting: Physician Assistant

## 2021-05-08 DIAGNOSIS — L400 Psoriasis vulgaris: Secondary | ICD-10-CM

## 2021-05-08 MED ORDER — CIMZIA PREFILLED 2 X 200 MG/ML ~~LOC~~ PSKT: 400.0000 mg | PREFILLED_SYRINGE | SUBCUTANEOUS | 10 refills | Status: DC

## 2021-05-08 NOTE — Telephone Encounter (Signed)
Bloodwork results for refill of Cimizia.

## 2021-05-08 NOTE — Telephone Encounter (Signed)
Spoke with patient regarding TB Gold results. I informed patient that I would get her refill of Cimzia sent to her pharmacy.

## 2021-05-25 ENCOUNTER — Telehealth: Payer: Self-pay | Admitting: Physician Assistant

## 2021-05-25 DIAGNOSIS — L400 Psoriasis vulgaris: Secondary | ICD-10-CM

## 2021-05-25 MED ORDER — CIMZIA PREFILLED 2 X 200 MG/ML ~~LOC~~ PSKT: 400.0000 mg | PREFILLED_SYRINGE | SUBCUTANEOUS | 10 refills | Status: AC

## 2021-05-25 NOTE — Telephone Encounter (Addendum)
Patient is calling to say that her insurance and prescription information has changed since the first of the year.  Her insurance is now [employer] through her spouse's employer and the information has been put into Epic and verified.  Her prescription information [facility]. [facility] # [id number] PCN is [id number].  Group number is [id number]. Patient needs a refill on Cimzia.  Patient says that a prior authorization needs to be submitted.  Patient will bring a copy of the insurance card by so that it can be scanned in to the system.  Cimzia Prefilled Certolizumab Pegol (CIMZIA PREFILLED) 2 X 200 MG/ML PSKT

## 2021-05-29 NOTE — Telephone Encounter (Signed)
Prior authorization done through Cover My Meds for the patient's Cimzia.    [facility] is reviewing your PA request. Typically an electronic response will be received within 24-72 hours. To check for an update later, open this request from your dashboard.  You may close this dialog and return to your dashboard to perform other tasks.

## 2021-05-29 NOTE — Telephone Encounter (Signed)
Fax received from [place] [employer] needing a prior authorization for the patient's Cimzia.

## 2021-12-06 ENCOUNTER — Telehealth: Payer: Self-pay

## 2021-12-06 NOTE — Telephone Encounter (Signed)
Tried to refill cimiza

## 2021-12-11 ENCOUNTER — Telehealth: Payer: Self-pay | Admitting: *Deleted

## 2021-12-11 NOTE — Telephone Encounter (Signed)
Prior authorization approved for patients Cimzia.

## 2021-12-11 NOTE — Telephone Encounter (Signed)
Prior authorization done via cover my meds for patients cimzia. KeyGeorgetta Haber - PA Case ID: [id number] - Rx #: [id number]

## 2022-04-11 ENCOUNTER — Ambulatory Visit: Payer: BLUE CROSS/BLUE SHIELD | Admitting: Physician Assistant

## 2023-03-21 ENCOUNTER — Encounter: Payer: Self-pay | Admitting: Family Medicine

## 2023-03-21 ENCOUNTER — Ambulatory Visit: Payer: Commercial Managed Care - PPO | Admitting: Family Medicine

## 2023-03-21 VITALS — BP 134/87 | HR 60 | Temp 98.4°F | Ht 65.0 in | Wt 207.5 lb

## 2023-03-21 DIAGNOSIS — E66811 Obesity, class 1: Secondary | ICD-10-CM | POA: Diagnosis not present

## 2023-03-21 DIAGNOSIS — Z862 Personal history of diseases of the blood and blood-forming organs and certain disorders involving the immune mechanism: Secondary | ICD-10-CM

## 2023-03-21 DIAGNOSIS — F411 Generalized anxiety disorder: Secondary | ICD-10-CM

## 2023-03-21 DIAGNOSIS — N912 Amenorrhea, unspecified: Secondary | ICD-10-CM

## 2023-03-21 DIAGNOSIS — Z114 Encounter for screening for human immunodeficiency virus [HIV]: Secondary | ICD-10-CM

## 2023-03-21 DIAGNOSIS — E6609 Other obesity due to excess calories: Secondary | ICD-10-CM

## 2023-03-21 DIAGNOSIS — F339 Major depressive disorder, recurrent, unspecified: Secondary | ICD-10-CM | POA: Diagnosis not present

## 2023-03-21 DIAGNOSIS — Z0001 Encounter for general adult medical examination with abnormal findings: Secondary | ICD-10-CM | POA: Diagnosis not present

## 2023-03-21 DIAGNOSIS — Z23 Encounter for immunization: Secondary | ICD-10-CM | POA: Diagnosis not present

## 2023-03-21 DIAGNOSIS — L4050 Arthropathic psoriasis, unspecified: Secondary | ICD-10-CM

## 2023-03-21 DIAGNOSIS — Z6834 Body mass index (BMI) 34.0-34.9, adult: Secondary | ICD-10-CM

## 2023-03-21 DIAGNOSIS — Z124 Encounter for screening for malignant neoplasm of cervix: Secondary | ICD-10-CM

## 2023-03-21 DIAGNOSIS — Z0000 Encounter for general adult medical examination without abnormal findings: Secondary | ICD-10-CM

## 2023-03-21 DIAGNOSIS — Z1211 Encounter for screening for malignant neoplasm of colon: Secondary | ICD-10-CM

## 2023-03-21 DIAGNOSIS — Z1159 Encounter for screening for other viral diseases: Secondary | ICD-10-CM

## 2023-03-21 LAB — BAYER DCA HB A1C WAIVED: HB A1C (BAYER DCA - WAIVED): 5 % (ref 4.8–5.6)

## 2023-03-21 MED ORDER — FLUOXETINE HCL 20 MG PO CAPS: 20.0000 mg | ORAL_CAPSULE | Freq: Every day | ORAL | 3 refills | Status: DC

## 2023-03-21 NOTE — Patient Instructions (Signed)

## 2023-03-21 NOTE — Progress Notes (Signed)
Complete physical exam  Patient: Mackenzie Mccall   DOB: [birth year]   47 y.o. Female  MRN: [id number]  Subjective:    Chief Complaint  Patient presents with   Annual Exam    Ronelle Stache is a 47 y.o. female who presents today for a complete physical exam. She reports consuming a  healthy  diet.  Walks throughout day at work.  She generally feels well. She reports sleeping fairly well. She does have additional problems to discuss today.   She has not had a cycle in 10 years. She has not seen an OB/GYN since 2019 shortly after undergoing some fertility treatments. She is overdue for a pap. She does have hot flashes.   She sees dermatology for psoriatic arthritis. On cimzia with good control over all.   Hx of recurrent anxiety and depression. She has never been on medication for this. She does report that her symptoms significantly impact her life and others and noticed her anxiety and depression as well. She tried to mask her symptoms all day while at work and around family. This is exhausting for her. She is ready to try medication at this point. Denies SI.   Most recent fall risk assessment:    03/21/2023    2:28 PM  Fall Risk   Falls in the past year? 1  Number falls in past yr: 0  Injury with Fall? 0  Risk for fall due to : History of fall(s)  Follow up Falls evaluation completed     Most recent depression screenings:    03/21/2023    2:29 PM  Depression screen PHQ 2/9  Decreased Interest 1  Down, Depressed, Hopeless 1  PHQ - 2 Score 2  Altered sleeping 1  Tired, decreased energy 1  Change in appetite 0  Feeling bad or failure about yourself  1  Trouble concentrating 2  Moving slowly or fidgety/restless 0  Suicidal thoughts 0  PHQ-9 Score 7  Difficult doing work/chores Not difficult at all      03/21/2023    2:29 PM  GAD 7 : Generalized Anxiety Score  Nervous, Anxious, on Edge 1  Control/stop worrying 1  Worry too much - different things 2  Trouble relaxing  2  Restless 1  Easily annoyed or irritable 1  Afraid - awful might happen 0  Total GAD 7 Score 8  Anxiety Difficulty Somewhat difficult      Vision:Not within last year  and Dental: No current dental problems and Receives regular dental care  Past Medical History:  Diagnosis Date   Anemia    Anxiety    Asthma    Depression    Psoriasis    Substance abuse (HCC)    opiods-last used age 25      Patient Care Team: Gabriel Earing, FNP as PCP - General (Family Medicine) Janalyn Harder, MD (Inactive) as Consulting Physician (Dermatology) Glyn Ade, PA-C as Physician Assistant (Dermatology)   Outpatient Medications Prior to Visit  Medication Sig   clobetasol (TEMOVATE) 0.05 % external solution Apply 1 application topically 2 (two) times daily.   Multiple Vitamin (MULTIVITAMIN) tablet Take 1 tablet by mouth daily.   Certolizumab Pegol (CIMZIA PREFILLED) 2 X 200 MG/ML PSKT Inject 400 mg into the skin every 14 (fourteen) days. (Patient not taking: Reported on 03/21/2023)   [DISCONTINUED] alclomethasone (ACLOVATE) 0.05 % cream Apply topically 2 (two) times daily as needed (Rash). Apply to face as directed.   [DISCONTINUED] clobetasol ointment (TEMOVATE) 0.05 %  Apply 1 application topically 2 (two) times daily.   [DISCONTINUED] mupirocin ointment (BACTROBAN) 2 % Apply 1 application topically 2 (two) times daily.   No facility-administered medications prior to visit.    ROS Negative unless specially indicated above in HPI.     Objective:     BP 134/87   Pulse 60   Temp 98.4 F (36.9 C) (Temporal)   Ht 5\' 5"  (1.651 m)   Wt 207 lb 8 oz (94.1 kg)   SpO2 95%   BMI 34.53 kg/m    Physical Exam Vitals and nursing note reviewed.  Constitutional:      General: She is not in acute distress.    Appearance: She is obese. She is not ill-appearing, toxic-appearing or diaphoretic.  HENT:     Head: Normocephalic and atraumatic.     Right Ear: Tympanic membrane, ear  canal and external ear normal.     Left Ear: Tympanic membrane and external ear normal.     Nose: Nose normal.     Mouth/Throat:     Mouth: Mucous membranes are moist.     Pharynx: Oropharynx is clear.  Eyes:     Extraocular Movements: Extraocular movements intact.     Pupils: Pupils are equal, round, and reactive to light.  Neck:     Thyroid: No thyroid mass, thyromegaly or thyroid tenderness.  Cardiovascular:     Rate and Rhythm: Normal rate and regular rhythm.     Heart sounds: Normal heart sounds. No murmur heard. Pulmonary:     Effort: Pulmonary effort is normal. No respiratory distress.     Breath sounds: Normal breath sounds. No wheezing, rhonchi or rales.  Abdominal:     General: Bowel sounds are normal.     Palpations: Abdomen is soft.  Musculoskeletal:     Cervical back: Neck supple. No rigidity.     Right lower leg: No edema.     Left lower leg: No edema.  Skin:    General: Skin is warm and dry.  Neurological:     General: No focal deficit present.     Mental Status: She is alert and oriented to person, place, and time.     Cranial Nerves: No cranial nerve deficit.     Sensory: No sensory deficit.     Motor: No weakness.     Coordination: Coordination normal.     Gait: Gait normal.  Psychiatric:        Attention and Perception: Attention normal.        Mood and Affect: Mood is anxious.        Speech: Speech normal.        Behavior: Behavior normal. Behavior is cooperative.        Thought Content: Thought content normal.        Cognition and Memory: Cognition normal.        Judgment: Judgment normal.      No results found for any visits on 03/21/23.     Assessment & Plan:    Routine Health Maintenance and Physical Exam  Polet was seen today for annual exam.  Diagnoses and all orders for this visit:  Routine general medical examination at a health care facility  Class 1 obesity due to excess calories without serious comorbidity with body mass  index (BMI) of 34.0 to 34.9 in adult Diet, exercise. Labs pending.  -     CMP14+EGFR -     Lipid panel -     TSH -  Bayer DCA Hb A1c Waived -     VITAMIN D 25 Hydroxy (Vit-D Deficiency, Fractures)  Depression, recurrent (HCC) Generalized anxiety disorder Uncontrolled. Denies SI. Start prozac as below.  -     FLUoxetine (PROZAC) 20 MG capsule; Take 1 capsule (20 mg total) by mouth daily.  Psoriatic arthritis (HCC) Managed by dermatology. On Cimzia.   Amenorrhea Reports no cycle x 10 years. Referral to GYN For further evaluation and cervical cancer screening.  -     Ambulatory referral to Gynecology  History of anemia Will check labs as below.  -     CBC with Differential/Platelet -     Iron, TIBC and Ferritin Panel -     Vitamin B12 -     Folate  Need for hepatitis C screening test -     Hepatitis C antibody  Encounter for screening for HIV -     HIV antibody (with reflex)  Cervical cancer screening -     Ambulatory referral to Gynecology  Colon cancer screening -     Ambulatory referral to Gastroenterology  Encounter for immunization Flu vaccine today in office.  -     Flu vaccine trivalent PF, 6mos and older(Flulaval,Afluria,Fluarix,Fluzone)    Immunization History  Administered Date(s) Administered   Influenza, Seasonal, Injecte, Preservative Fre 03/21/2023   Tdap 05/17/2020    Health Maintenance  Topic Date Due   Colonoscopy  Never done   Cervical Cancer Screening (HPV/Pap Cotest)  03/20/2024 (Originally 10/12/2007)   COVID-19 Vaccine (1) 04/05/2024 (Originally 10/12/1982)   DTaP/Tdap/Td (2 - Td or Tdap) 05/17/2030   INFLUENZA VACCINE  Completed   Hepatitis C Screening  Completed   HIV Screening  Completed   HPV VACCINES  Aged Out    Discussed health benefits of physical activity, and encouraged her to engage in regular exercise appropriate for her age and condition.  Problem List Items Addressed This Visit       Musculoskeletal and Integument    Psoriatic arthritis (HCC)     Other   Class 1 obesity due to excess calories without serious comorbidity with body mass index (BMI) of 34.0 to 34.9 in adult   Relevant Orders   CMP14+EGFR (Completed)   Lipid panel (Completed)   TSH (Completed)   Bayer DCA Hb A1c Waived (Completed)   VITAMIN D 25 Hydroxy (Vit-D Deficiency, Fractures) (Completed)   Depression, recurrent (HCC)   Relevant Medications   FLUoxetine (PROZAC) 20 MG capsule   Generalized anxiety disorder   Relevant Medications   FLUoxetine (PROZAC) 20 MG capsule   Amenorrhea   Relevant Orders   Ambulatory referral to Gynecology   History of anemia   Relevant Orders   CBC with Differential/Platelet (Completed)   Iron, TIBC and Ferritin Panel (Completed)   Vitamin B12 (Completed)   Folate (Completed)   Other Visit Diagnoses     Routine general medical examination at a health care facility    -  Primary   Need for hepatitis C screening test       Relevant Orders   Hepatitis C antibody (Completed)   Encounter for screening for HIV       Relevant Orders   HIV antibody (with reflex) (Completed)   Cervical cancer screening       Relevant Orders   Ambulatory referral to Gynecology   Colon cancer screening       Relevant Orders   Ambulatory referral to Gastroenterology   Encounter for immunization  Relevant Orders   Flu vaccine trivalent PF, 6mos and older(Flulaval,Afluria,Fluarix,Fluzone) (Completed)      Return in about 4 weeks (around 04/18/2023) for medication follow up.   The patient indicates understanding of these issues and agrees with the plan.  Gabriel Earing, FNP

## 2023-03-22 ENCOUNTER — Other Ambulatory Visit: Payer: Self-pay | Admitting: Family Medicine

## 2023-03-22 DIAGNOSIS — E559 Vitamin D deficiency, unspecified: Secondary | ICD-10-CM

## 2023-03-22 LAB — CBC WITH DIFFERENTIAL/PLATELET
Basophils Absolute: 0 10*3/uL (ref 0.0–0.2)
Basos: 1 %
EOS (ABSOLUTE): 0.2 10*3/uL (ref 0.0–0.4)
Eos: 3 %
Hematocrit: 39.2 % (ref 34.0–46.6)
Hemoglobin: 12.8 g/dL (ref 11.1–15.9)
Immature Grans (Abs): 0 10*3/uL (ref 0.0–0.1)
Immature Granulocytes: 0 %
Lymphocytes Absolute: 2.2 10*3/uL (ref 0.7–3.1)
Lymphs: 34 %
MCH: 30.3 pg (ref 26.6–33.0)
MCHC: 32.7 g/dL (ref 31.5–35.7)
MCV: 93 fL (ref 79–97)
Monocytes Absolute: 0.4 10*3/uL (ref 0.1–0.9)
Monocytes: 7 %
Neutrophils Absolute: 3.6 10*3/uL (ref 1.4–7.0)
Neutrophils: 55 %
Platelets: 228 10*3/uL (ref 150–450)
RBC: 4.22 x10E6/uL (ref 3.77–5.28)
RDW: 12.9 % (ref 11.7–15.4)
WBC: 6.4 10*3/uL (ref 3.4–10.8)

## 2023-03-22 LAB — CMP14+EGFR
ALT: 46 [IU]/L — ABNORMAL HIGH (ref 0–32)
AST: 32 [IU]/L (ref 0–40)
Albumin: 4.8 g/dL (ref 3.9–4.9)
Alkaline Phosphatase: 76 [IU]/L (ref 44–121)
BUN/Creatinine Ratio: 23 (ref 9–23)
BUN: 14 mg/dL (ref 6–24)
Bilirubin Total: 0.4 mg/dL (ref 0.0–1.2)
CO2: 21 mmol/L (ref 20–29)
Calcium: 9.8 mg/dL (ref 8.7–10.2)
Chloride: 103 mmol/L (ref 96–106)
Creatinine, Ser: 0.62 mg/dL (ref 0.57–1.00)
Globulin, Total: 2.4 g/dL (ref 1.5–4.5)
Glucose: 82 mg/dL (ref 70–99)
Potassium: 4 mmol/L (ref 3.5–5.2)
Sodium: 142 mmol/L (ref 134–144)
Total Protein: 7.2 g/dL (ref 6.0–8.5)
eGFR: 112 mL/min/{1.73_m2} (ref >59)

## 2023-03-22 LAB — VITAMIN B12: Vitamin B-12: 749 pg/mL (ref 232–1245)

## 2023-03-22 LAB — LIPID PANEL
Chol/HDL Ratio: 4.3 ratio (ref 0.0–4.4)
Cholesterol, Total: 249 mg/dL — ABNORMAL HIGH (ref 100–199)
HDL: 58 mg/dL (ref >39)
LDL Chol Calc (NIH): 163 mg/dL — ABNORMAL HIGH (ref 0–99)
Triglycerides: 155 mg/dL — ABNORMAL HIGH (ref 0–149)
VLDL Cholesterol Cal: 28 mg/dL (ref 5–40)

## 2023-03-22 LAB — TSH: TSH: 1.19 u[IU]/mL (ref 0.450–4.500)

## 2023-03-22 LAB — IRON,TIBC AND FERRITIN PANEL
Ferritin: 169 ng/mL — ABNORMAL HIGH (ref 15–150)
Iron Saturation: 14 % — ABNORMAL LOW (ref 15–55)
Iron: 48 ug/dL (ref 27–159)
Total Iron Binding Capacity: 347 ug/dL (ref 250–450)
UIBC: 299 ug/dL (ref 131–425)

## 2023-03-22 LAB — HIV ANTIBODY (ROUTINE TESTING W REFLEX): HIV Screen 4th Generation wRfx: NONREACTIVE

## 2023-03-22 LAB — VITAMIN D 25 HYDROXY (VIT D DEFICIENCY, FRACTURES): Vit D, 25-Hydroxy: 26 ng/mL — ABNORMAL LOW (ref 30.0–100.0)

## 2023-03-22 LAB — FOLATE: Folate: 18.3 ng/mL (ref >3.0)

## 2023-03-22 LAB — HEPATITIS C ANTIBODY: Hep C Virus Ab: NONREACTIVE

## 2023-03-22 MED ORDER — VITAMIN D (ERGOCALCIFEROL) 1.25 MG (50000 UNIT) PO CAPS: 50000.0000 [IU] | ORAL_CAPSULE | ORAL | 0 refills | Status: DC

## 2023-03-26 ENCOUNTER — Encounter (INDEPENDENT_AMBULATORY_CARE_PROVIDER_SITE_OTHER): Payer: Self-pay | Admitting: *Deleted

## 2023-04-01 ENCOUNTER — Telehealth: Payer: Self-pay | Admitting: Family Medicine

## 2023-04-01 DIAGNOSIS — E559 Vitamin D deficiency, unspecified: Secondary | ICD-10-CM

## 2023-04-01 MED ORDER — VITAMIN D (ERGOCALCIFEROL) 1.25 MG (50000 UNIT) PO CAPS: 50000.0000 [IU] | ORAL_CAPSULE | ORAL | 0 refills | Status: DC

## 2023-04-01 NOTE — Telephone Encounter (Signed)
Copied from CRM #[id number]. Topic: Clinical - Medical Advice >> Apr 01, 2023  3:41 PM Theodis Sato wrote: Reason for CRM: PT would like Mackenzie Mccall to advise her: Pt would like to start with vitamin D courses to alleviate symptoms before starting Prozac.

## 2023-04-01 NOTE — Telephone Encounter (Signed)
[facility]  

## 2023-04-01 NOTE — Telephone Encounter (Signed)
That is fine if she prefers.

## 2023-04-01 NOTE — Telephone Encounter (Signed)
Left message to call back  

## 2023-04-01 NOTE — Telephone Encounter (Signed)
Copied from CRM [id number]. Topic: Clinical - Medication Refill >> Apr 01, 2023  3:36 PM Theodis Sato wrote: Most Recent Primary Care Visit:  Provider: Gabriel Earing  Department: [facility]  Visit Type: NEW PATIENT  Date: 03/21/2023  Medication: Vitamin D, Ergocalciferol, (DRISDOL) 1.25 MG (50000 UNIT) CAPS capsule  Has the patient contacted their pharmacy? Yes- Its not been ordered to pharmacy  (Agent: If no, request that the patient contact the pharmacy for the refill. If patient does not wish to contact the pharmacy document the reason why and proceed with request.) (Agent: If yes, when and what did the pharmacy advise?)  Is this the correct pharmacy for this prescription? Yes If no, delete pharmacy and type the correct one.  This is the patient's preferred pharmacy:   [id number] - [place], [place] - [address] [place] [address] Phone: [phone] Fax: [phone]   Has the prescription been filled recently? No  Is the patient out of the medication? Yes  Has the patient been seen for an appointment in the last year OR does the patient have an upcoming appointment? Yes  Can we respond through MyChart? Yes  Agent: Please be advised that Rx refills may take up to 3 business days. We ask that you follow-up with your pharmacy.

## 2023-04-01 NOTE — Telephone Encounter (Signed)
Patient aware and verbalized understanding. °

## 2023-04-18 ENCOUNTER — Ambulatory Visit: Payer: Commercial Managed Care - PPO | Admitting: Family Medicine

## 2023-04-30 ENCOUNTER — Other Ambulatory Visit (HOSPITAL_COMMUNITY)
Admission: RE | Admit: 2023-04-30 | Discharge: 2023-04-30 | Disposition: A | Discharge status: Home / Self Care | Payer: Commercial Managed Care - PPO | Source: Ambulatory Visit | Attending: Adult Health | Admitting: Adult Health

## 2023-04-30 ENCOUNTER — Ambulatory Visit: Payer: Commercial Managed Care - PPO | Admitting: Adult Health

## 2023-04-30 ENCOUNTER — Encounter: Payer: Self-pay | Admitting: Adult Health

## 2023-04-30 VITALS — BP 151/99 | HR 58 | Ht 65.5 in | Wt 217.5 lb

## 2023-04-30 DIAGNOSIS — Z1211 Encounter for screening for malignant neoplasm of colon: Secondary | ICD-10-CM | POA: Diagnosis not present

## 2023-04-30 DIAGNOSIS — R61 Generalized hyperhidrosis: Secondary | ICD-10-CM

## 2023-04-30 DIAGNOSIS — N841 Polyp of cervix uteri: Secondary | ICD-10-CM | POA: Diagnosis present

## 2023-04-30 DIAGNOSIS — R195 Other fecal abnormalities: Secondary | ICD-10-CM | POA: Diagnosis not present

## 2023-04-30 DIAGNOSIS — R030 Elevated blood-pressure reading, without diagnosis of hypertension: Secondary | ICD-10-CM | POA: Insufficient documentation

## 2023-04-30 DIAGNOSIS — Z01419 Encounter for gynecological examination (general) (routine) without abnormal findings: Secondary | ICD-10-CM | POA: Insufficient documentation

## 2023-04-30 DIAGNOSIS — Z1231 Encounter for screening mammogram for malignant neoplasm of breast: Secondary | ICD-10-CM

## 2023-04-30 DIAGNOSIS — Z1331 Encounter for screening for depression: Secondary | ICD-10-CM

## 2023-04-30 DIAGNOSIS — Z780 Asymptomatic menopausal state: Secondary | ICD-10-CM | POA: Diagnosis not present

## 2023-04-30 DIAGNOSIS — R232 Flushing: Secondary | ICD-10-CM

## 2023-04-30 LAB — HEMOCCULT GUIAC POC 1CARD (OFFICE): Fecal Occult Blood, POC: POSITIVE — AB

## 2023-04-30 NOTE — Progress Notes (Signed)
 Patient ID: Mackenzie Mccall, female   DOB: May 08, 1975, 47 y.o.   MRN: [id number] History of Present Illness: Mackenzie Mccall is a 47 year old white female, married, PM, last period about age 72, has hot flashes and night sweats, she is in for well woman gyn exam and pap. She is a new pt, and says last pap about 2020.  She works at [employer] and her son is 69 and has autism.   PCP is Annabella Search NP   Current Medications, Allergies, Past Medical History, Past Surgical History, Family History and Social History were reviewed in [employer] record.     Review of Systems: Patient denies any headaches, hearing loss, fatigue, blurred vision, shortness of breath, chest pain, abdominal pain, problems with bowel movements, urination, or intercourse(not currently active). No joint pain or mood swings.  No period in 10 years +hot flashes and night sweats   Physical Exam:BP (!) 151/99 (BP Location: Right Arm, Patient Position: Sitting, Cuff Size: Normal)   Pulse (!) 58   Ht 5' 5.5 (1.664 m)   Wt 217 lb 8 oz (98.7 kg)   BMI 35.64 kg/m   General:  Well developed, well nourished, no acute distress Skin:  Warm and dry Neck:  Midline trachea, normal thyroid, good ROM, no lymphadenopathy Lungs; Clear to auscultation bilaterally Breast:  No dominant palpable mass, retraction, or nipple discharge Cardiovascular: Regular rate and rhythm Abdomen:  Soft, non tender, no hepatosplenomegaly Pelvic:  External genitalia is normal in appearance, no lesions.  The vagina is pale. Urethra has no lesions or masses. The cervix is bulbous, has cervical polyp at os, pap with HR HPV genotyping performed, and pt gave verbal consent and polyp was grasped with forceps and twisted, off, placed in pathology pot.  Uterus is felt to be normal size, shape, and contour.  No adnexal masses or tenderness noted.Bladder is non tender, no masses felt. Rectal: Good sphincter tone, no polyps, ?+hemorrhoids felt.   Hemoccult positive Extremities/musculoskeletal:  No swelling or varicosities noted, no clubbing or cyanosis Psych:  No mood changes, alert and cooperative,seems happy AA is 2 Fall risk is moderate    04/30/2023    9:23 AM 03/21/2023    2:29 PM  Depression screen PHQ 2/9  Decreased Interest 0 1  Down, Depressed, Hopeless 0 1  PHQ - 2 Score 0 2  Altered sleeping 0 1  Tired, decreased energy 1 1  Change in appetite 1 0  Feeling bad or failure about yourself  0 1  Trouble concentrating 0 2  Moving slowly or fidgety/restless 0 0  Suicidal thoughts 0 0  PHQ-9 Score 2 7  Difficult doing work/chores  Not difficult at all       04/30/2023    9:23 AM 03/21/2023    2:29 PM  GAD 7 : Generalized Anxiety Score  Nervous, Anxious, on Edge 0 1  Control/stop worrying 0 1  Worry too much - different things 0 2  Trouble relaxing 1 2  Restless 0 1  Easily annoyed or irritable 0 1  Afraid - awful might happen 0 0  Total GAD 7 Score 1 8  Anxiety Difficulty  Somewhat difficult      Upstream - 04/30/23 9060       Pregnancy Intention Screening   Does the patient want to become pregnant in the next year? No    Does the patient's partner want to become pregnant in the next year? No    Would the  patient like to discuss contraceptive options today? No      Contraception Wrap Up   Current Method No Method - Other Reason    End Method No Method - Other Reason            Examination chaperoned by Clarita Salt LPN  Impression and plan: 1. Encounter for gynecological examination with Papanicolaou smear of cervix (Primary) Pap sent Pap in 3-5 years if normal Physical in 1 year - Cytology - PAP( [place]) Labs with PCP  2. Encounter for screening fecal occult blood testing Hemoccult was positive - POCT occult blood stool  3. Positive fecal occult blood test Will give 3 hemoccult cards to do at home and return - POCT occult blood stool  4. Postmenopause No period in 10  years Will check FSH - Follicle stimulating hormone  5. Hot flashes Has hot flashes  6. Night sweats Has had night sweats for years   7. Cervical polyp Cervical polyp removed and sent to pathology - Surgical pathology( [place]/ [facility]) Will talk when results back   8. Screening mammogram for breast cancer Mammogram scheduled for her for 05/02/23 at 7:45 am at [facility] - MM 3D SCREENING MAMMOGRAM BILATERAL BREAST; Future  9. Elevated BP without diagnosis of hypertension Keep check on BP Follow up with PCP

## 2023-05-01 LAB — FOLLICLE STIMULATING HORMONE: FSH: 61.6 m[IU]/mL

## 2023-05-02 ENCOUNTER — Ambulatory Visit (HOSPITAL_COMMUNITY)
Admission: RE | Admit: 2023-05-02 | Discharge: 2023-05-02 | Disposition: A | Discharge status: Home / Self Care | Payer: Commercial Managed Care - PPO | Source: Ambulatory Visit | Attending: Adult Health | Admitting: Adult Health

## 2023-05-02 ENCOUNTER — Encounter (HOSPITAL_COMMUNITY): Payer: Self-pay

## 2023-05-02 DIAGNOSIS — Z1231 Encounter for screening mammogram for malignant neoplasm of breast: Secondary | ICD-10-CM | POA: Insufficient documentation

## 2023-05-03 LAB — SURGICAL PATHOLOGY

## 2023-05-06 LAB — CYTOLOGY - PAP
Comment: NEGATIVE
Diagnosis: NEGATIVE
High risk HPV: NEGATIVE

## 2023-05-21 ENCOUNTER — Other Ambulatory Visit: Payer: Commercial Managed Care - PPO

## 2023-05-21 ENCOUNTER — Telehealth: Payer: Self-pay | Admitting: *Deleted

## 2023-05-21 DIAGNOSIS — Z1211 Encounter for screening for malignant neoplasm of colon: Secondary | ICD-10-CM

## 2023-05-21 DIAGNOSIS — Z1212 Encounter for screening for malignant neoplasm of rectum: Secondary | ICD-10-CM | POA: Diagnosis not present

## 2023-05-21 LAB — HEMOCCULT GUIAC POC 1CARD (OFFICE)
Card #2 Fecal Occult Blod, POC: NEGATIVE
Card #3 Fecal Occult Blood, POC: NEGATIVE
Fecal Occult Blood, POC: NEGATIVE

## 2023-05-21 NOTE — Telephone Encounter (Signed)
Pt aware all 3 hemocult cards were normal. Pt voiced understanding. JSY

## 2023-06-21 ENCOUNTER — Ambulatory Visit: Payer: Commercial Managed Care - PPO | Admitting: Family Medicine

## 2023-06-21 ENCOUNTER — Encounter: Payer: Self-pay | Admitting: Family Medicine

## 2023-06-21 VITALS — BP 123/70 | HR 68 | Temp 98.1°F | Ht 65.5 in | Wt 211.2 lb

## 2023-06-21 DIAGNOSIS — E559 Vitamin D deficiency, unspecified: Secondary | ICD-10-CM

## 2023-06-21 DIAGNOSIS — F411 Generalized anxiety disorder: Secondary | ICD-10-CM

## 2023-06-21 DIAGNOSIS — R748 Abnormal levels of other serum enzymes: Secondary | ICD-10-CM

## 2023-06-21 DIAGNOSIS — F339 Major depressive disorder, recurrent, unspecified: Secondary | ICD-10-CM

## 2023-06-21 NOTE — Progress Notes (Signed)
Acute Office Visit  Subjective:     Patient ID: Mackenzie Mccall, female    DOB: 09/03/1975, 48 y.o.   MRN: [id number]  Chief Complaint  Patient presents with   Medical Management of Chronic Issues    HPI Patient is in today for follow up of anxiety and depression. She did not start prozac. She reports that her symptoms are well controlled. She wanted to see if she would feel better after vit D supplementation and she has. She has completed the prescription course vitamin D and is currently taking 2000 international units daily.   AST was mildly elevated on last labs. She had incidentally double dose of tylenol with cold medications. Typically not not take tylenol. Rarely has alcoholic beverage. Denies RUQ pain, nausea, vomiting, weight loss.       04/30/2023    9:23 AM 03/21/2023    2:29 PM  Depression screen PHQ 2/9  Decreased Interest 0 1  Down, Depressed, Hopeless 0 1  PHQ - 2 Score 0 2  Altered sleeping 0 1  Tired, decreased energy 1 1  Change in appetite 1 0  Feeling bad or failure about yourself  0 1  Trouble concentrating 0 2  Moving slowly or fidgety/restless 0 0  Suicidal thoughts 0 0  PHQ-9 Score 2 7  Difficult doing work/chores  Not difficult at all      04/30/2023    9:23 AM 03/21/2023    2:29 PM  GAD 7 : Generalized Anxiety Score  Nervous, Anxious, on Edge 0 1  Control/stop worrying 0 1  Worry too much - different things 0 2  Trouble relaxing 1 2  Restless 0 1  Easily annoyed or irritable 0 1  Afraid - awful might happen 0 0  Total GAD 7 Score 1 8  Anxiety Difficulty  Somewhat difficult     ROS As per HPI.      Objective:    BP 123/70   Pulse 68   Temp 98.1 F (36.7 C) (Temporal)   Ht 5' 5.5" (1.664 m)   Wt 211 lb 3.2 oz (95.8 kg)   SpO2 97%   BMI 34.61 kg/m    Physical Exam Vitals and nursing note reviewed.  Constitutional:      General: She is not in acute distress.    Appearance: She is obese. She is not ill-appearing,  toxic-appearing or diaphoretic.  Eyes:     General: No scleral icterus. Cardiovascular:     Rate and Rhythm: Normal rate and regular rhythm.     Heart sounds: Normal heart sounds. No murmur heard. Pulmonary:     Effort: Pulmonary effort is normal. No respiratory distress.     Breath sounds: Normal breath sounds. No wheezing, rhonchi or rales.  Abdominal:     General: Bowel sounds are normal. There is no distension.     Palpations: Abdomen is soft.     Tenderness: There is no abdominal tenderness. There is no guarding or rebound.  Musculoskeletal:     Cervical back: Neck supple. No rigidity.     Right lower leg: No edema.     Left lower leg: No edema.  Skin:    General: Skin is warm and dry.     Coloration: Skin is not jaundiced.  Neurological:     General: No focal deficit present.     Mental Status: She is alert and oriented to person, place, and time.  Psychiatric:        Mood and  Affect: Mood normal.        Behavior: Behavior normal.     No results found for any visits on 06/21/23.      Assessment & Plan:   Mackenzie Mccall was seen today for medical management of chronic issues.  Diagnoses and all orders for this visit:  Depression, recurrent (HCC) Generalized anxiety disorder Well controlled without medication currently.   Vitamin D deficiency On repletion therapy. Will recheck today.  -     Vitamin D, 25-hydroxy  Elevated liver enzymes Will recheck. No symptoms. Discussed diet, exercise, and weight loss for possibility of fatty liver.  -     CMP14+EGFR   Return in about 9 months (around 03/20/2024) for CPE. Sooner for new or worsening symptoms.   The patient indicates understanding of these issues and agrees with the plan.  Gabriel Earing, FNP

## 2023-06-22 LAB — CMP14+EGFR
ALT: 56 [IU]/L — ABNORMAL HIGH (ref 0–32)
AST: 33 [IU]/L (ref 0–40)
Albumin: 5.1 g/dL — ABNORMAL HIGH (ref 3.9–4.9)
Alkaline Phosphatase: 75 [IU]/L (ref 44–121)
BUN/Creatinine Ratio: 32 — ABNORMAL HIGH (ref 9–23)
BUN: 19 mg/dL (ref 6–24)
Bilirubin Total: 0.2 mg/dL (ref 0.0–1.2)
CO2: 23 mmol/L (ref 20–29)
Calcium: 9.9 mg/dL (ref 8.7–10.2)
Chloride: 102 mmol/L (ref 96–106)
Creatinine, Ser: 0.6 mg/dL (ref 0.57–1.00)
Globulin, Total: 2.4 g/dL (ref 1.5–4.5)
Glucose: 80 mg/dL (ref 70–99)
Potassium: 4.1 mmol/L (ref 3.5–5.2)
Sodium: 143 mmol/L (ref 134–144)
Total Protein: 7.5 g/dL (ref 6.0–8.5)
eGFR: 113 mL/min/{1.73_m2} (ref >59)

## 2023-06-22 LAB — VITAMIN D 25 HYDROXY (VIT D DEFICIENCY, FRACTURES): Vit D, 25-Hydroxy: 43.4 ng/mL (ref 30.0–100.0)

## 2023-06-24 ENCOUNTER — Encounter: Payer: Self-pay | Admitting: Family Medicine

## 2023-08-15 ENCOUNTER — Ambulatory Visit: Admitting: Family Medicine

## 2023-08-15 ENCOUNTER — Encounter: Payer: Self-pay | Admitting: Family Medicine

## 2023-08-15 VITALS — BP 111/71 | HR 58 | Temp 97.4°F | Ht 65.0 in | Wt 214.0 lb

## 2023-08-15 DIAGNOSIS — W57XXXA Bitten or stung by nonvenomous insect and other nonvenomous arthropods, initial encounter: Secondary | ICD-10-CM | POA: Diagnosis not present

## 2023-08-15 DIAGNOSIS — S70262A Insect bite (nonvenomous), left hip, initial encounter: Secondary | ICD-10-CM

## 2023-08-15 NOTE — Progress Notes (Signed)
   Acute Office Visit  Subjective:     Patient ID: Mackenzie Mccall, female    DOB: 09/28/1975, 48 y.o.   MRN: [id number]  Chief Complaint  Patient presents with   insect bite to left hip area    Happened Tuesday while doing yard work    HPI Patient is in today for a suspected tick bite. She noticed a bite to her left upper hip around her waistline. Did not see the tick or another insect. Bite occurred after doing some yard work and cleaning out her garage 2 days ago. Would not been attached for <24 hours. Reports clear, yellow drainage. It is itchy. No rash, fever, chills, neck pain, HA, myalgias, new arthralgias, numbness, tingling, weakness, lymphadenopathy, nausea, vomiting.   ROS As per HPI.      Objective:    BP 111/71   Pulse (!) 58   Temp (!) 97.4 F (36.3 C) (Temporal)   Ht 5\' 5"  (1.651 m)   Wt 214 lb (97.1 kg)   SpO2 98%   BMI 35.61 kg/m    Physical Exam Vitals and nursing note reviewed.  Constitutional:      General: She is not in acute distress.    Appearance: Normal appearance. She is not ill-appearing, toxic-appearing or diaphoretic.  Pulmonary:     Effort: Pulmonary effort is normal. No respiratory distress.  Musculoskeletal:     Cervical back: No rigidity.     Right lower leg: No edema.     Left lower leg: No edema.  Skin:    General: Skin is warm and dry.     Comments: Raised erythematous area measuring 2 cm. No exudate, warmth, tenderness. No rash or central clearing.   Neurological:     General: No focal deficit present.     Mental Status: She is alert and oriented to person, place, and time.  Psychiatric:        Mood and Affect: Mood normal.        Behavior: Behavior normal.        Thought Content: Thought content normal.        Judgment: Judgment normal.     No results found for any visits on 08/15/23.      Assessment & Plan:   [name] was seen today for insect bite to left hip area.  Diagnoses and all orders for this visit:  Tick  bite of left hip, initial encounter No tick borne illness symptoms. Discussed symptomatic care for itching and strict return precautions.   Return if symptoms worsen or fail to improve.  The patient indicates understanding of these issues and agrees with the plan.  Albertha Huger, FNP

## 2023-08-15 NOTE — Patient Instructions (Signed)
Tick Bite Information, Adult  Ticks are insects that draw blood for food. They climb onto people and animals that brush against the leaves and grasses that they live in. They then bite and attach to the skin. Most ticks are harmless, but some ticks may carry germs that can cause disease. These germs are spread to a person through a bite. To lower your risk of getting a disease from a tick bite, make sure you: Take steps to prevent tick bites. Check for ticks after being outdoors where ticks live. Watch for symptoms of disease if a tick attached to you or if you think a tick bit you. How can I prevent tick bites? Take these steps to help prevent tick bites when you go outdoors in an area where ticks live: Before you go outdoors: Wear long sleeves and long pants to protect your skin from ticks. Wear light-colored clothing so you can see ticks easier. Tuck your pant legs into your socks. Apply insect repellent that has DEET (20% or higher), picaridin, or IR3535 in it to the following areas: Any bare skin. Avoid areas around the eyes and mouth. Edges of clothing, like the top of your boots, the bottom of your pant legs, and your sleeve cuffs. Consider applying an insect repellant that contains permethrin. Follow the instructions on the label. Do not apply permethrin directly to the skin. Instead, apply to the following areas: Clothing and shoes. Outdoor gear and tents. When you are outdoors: Avoid walking through areas with long grass. If you are walking on a trail, stay in the middle of the trail so your skin, hair, and clothing do not touch the bushes. Check for ticks on your clothing, hair, and skin often while you are outdoors. Check again before you go inside. When you go indoors: Check your clothing for ticks. Tumble dry clothes in a dryer on high heat for at least 10 minutes. If clothes are damp, additional time may be needed. If clothes require washing, use hot water. Check your gear and  pets. Shower soon after being outdoors. Check your body for ticks. Do a full body check using a mirror. Be sure to check your scalp, neck, armpits, waist, groin, and joint areas. These are the spots where ticks attach themselves most often. What is the best way to remove a tick?  Remove the tick as soon as possible. Removing it can prevent germs from passing to your body. Do not remove the tick with your bare fingers. Do not try to remove a tick with heat, alcohol, petroleum jelly, or fingernail polish. These things can cause the tick to salivate and regurgitate into your bloodstream, increasing your risk of getting a disease. To remove a tick that is crawling on your skin: Go outside and brush the tick off. Use tape or a lint roller. To remove a tick that is attached to your skin: Wash your hands. If you have gloves, put them on. Use a fine-tipped tweezer, curved forceps, or a tick-removal tool to gently grasp the tick as close to your skin and the tick's head as possible. Gently pull with a steady, upward, and even pressure until the tick lets go. While removing the tick: Take care to keep the tick's head attached to its body. Do not twist or jerk the tick. This can make the tick's head or mouth parts break off and stay in your skin. If this happens, try to remove the mouth parts with tweezers. If you cannot remove them, leave   the area alone and let the skin heal. Do not squeeze or crush the tick's body. This could force disease-carrying fluids from the tick into your body. What should I do after removing a tick? Clean the bite area and your hands with soap and water, rubbing alcohol, or an iodine scrub. If an antiseptic cream or ointment is available, put a small amount on the bite area. Wash and disinfect any tools that you used to remove the tick. How should I dispose of a tick? To dispose of a live tick, use one of these methods: Place it in rubbing alcohol. Place it in a sealed bag  or container, and throw it away. Wrap it tightly in tape, and throw it away. Flush it down the toilet. Where to find more information Centers for Disease Control and Prevention: cdc.gov/ticks [place] Environmental Protection Agency: epa.gov/insect-repellents Contact a health care provider if: You have symptoms of a disease after a tick bite. Symptoms of a tick-borne disease can occur from moments after the tick bites to 30 days after a tick is removed. Symptoms include: Fever or chills. A red rash that makes a circle (bull's-eye rash) in the bite area. Redness and swelling in the bite area. Headache or stiff neck. Muscle, joint, or bone pain. Abnormal tiredness. Numbness in your legs or trouble walking or moving your legs. Tender or swollen lymph glands. Abdominal pain, vomiting, diarrhea, or weight loss. Get help right away if: You are not able to remove a tick. You have muscle weakness or paralysis. Your symptoms get worse or you experience new symptoms. You find an engorged tick on your skin and you are in an area where there is a higher risk of disease from ticks. Summary Ticks may carry germs that can spread to a person through a bite. These germs can cause disease. Wear protective clothing and use insect repellent to prevent tick bites. Follow the instructions on the label. If you find a tick on your body, remove it as soon as possible. If the tick is attached, do not try to remove it with heat, alcohol, petroleum jelly, or fingernail polish. If you have symptoms of a disease after being bitten by a tick, contact a health care provider. This information is not intended to replace advice given to you by your health care provider. Make sure you discuss any questions you have with your health care provider. Document Revised: 07/17/2021 Document Reviewed: 07/17/2021 Elsevier Patient Education  2024 [employer]  

## 2023-09-24 ENCOUNTER — Encounter (INDEPENDENT_AMBULATORY_CARE_PROVIDER_SITE_OTHER): Payer: Self-pay | Admitting: *Deleted

## 2024-03-05 ENCOUNTER — Ambulatory Visit

## 2024-03-05 DIAGNOSIS — Z23 Encounter for immunization: Secondary | ICD-10-CM

## 2024-03-23 ENCOUNTER — Ambulatory Visit: Payer: Commercial Managed Care - PPO | Admitting: Family Medicine

## 2024-03-23 ENCOUNTER — Encounter: Payer: Self-pay | Admitting: Family Medicine

## 2024-03-23 VITALS — BP 134/84 | HR 68 | Temp 98.3°F | Ht 65.0 in | Wt 207.6 lb

## 2024-03-23 DIAGNOSIS — Z0000 Encounter for general adult medical examination without abnormal findings: Secondary | ICD-10-CM

## 2024-03-23 DIAGNOSIS — Z0001 Encounter for general adult medical examination with abnormal findings: Secondary | ICD-10-CM | POA: Diagnosis not present

## 2024-03-23 DIAGNOSIS — Z130 Encounter for screening for diseases of the blood and blood-forming organs and certain disorders involving the immune mechanism: Secondary | ICD-10-CM

## 2024-03-23 DIAGNOSIS — Z13220 Encounter for screening for lipoid disorders: Secondary | ICD-10-CM

## 2024-03-23 NOTE — Patient Instructions (Signed)

## 2024-03-23 NOTE — Progress Notes (Unsigned)
 Complete physical exam  Patient: Mackenzie Mccall   DOB: Feb 28, 1976   48 y.o. Female  MRN: [id number]  Subjective:    Chief Complaint  Patient presents with   Annual Exam    Mackenzie Mccall is a 48 y.o. female who presents today for a complete physical exam. She reports consuming a {diet types:17450} diet. {types:19826} She generally feels {DESC; WELL/FAIRLY WELL/POORLY:18703}. She reports sleeping {DESC; WELL/FAIRLY WELL/POORLY:18703}. She {does/does not:200015} have additional problems to discuss today.    Most recent fall risk assessment:    03/23/2024    2:49 PM  Fall Risk   Falls in the past year? 0     Most recent depression screenings:    03/23/2024    2:50 PM 08/15/2023    9:40 AM  PHQ 2/9 Scores  PHQ - 2 Score 0 0  PHQ- 9 Score 0     {VISON DENTAL STD PSA (Optional):27386}  {History (Optional):23778}  Patient Care Team: Joesph Annabella HERO, FNP as PCP - General (Family Medicine) Livingston Rigg, MD as Consulting Physician (Dermatology) [place], Andrez SAUNDERS, PA-C (Inactive) as Physician Assistant (Dermatology)   Outpatient Medications Prior to Visit  Medication Sig   ASHWAGANDHA PO Take by mouth.   Certolizumab Pegol  (CIMZIA  PREFILLED) 2 X 200 MG/ML PSKT Inject 400 mg into the skin every 14 (fourteen) days.   clobetasol  (TEMOVATE ) 0.05 % external solution Apply 1 application topically 2 (two) times daily.   FLUoxetine  (PROZAC ) 20 MG capsule Take 1 capsule (20 mg total) by mouth daily. (Patient not taking: Reported on 08/15/2023)   Multiple Vitamin (MULTIVITAMIN) tablet Take 1 tablet by mouth daily.   No facility-administered medications prior to visit.    ROS        Objective:     BP (!) 150/80   Pulse 68   Temp 98.3 F (36.8 C) (Temporal)   Ht 5' 5 (1.651 m)   Wt 207 lb 9.6 oz (94.2 kg)   SpO2 99%   BMI 34.55 kg/m  Wt Readings from Last 3 Encounters:  03/23/24 207 lb 9.6 oz (94.2 kg)  08/15/23 214 lb (97.1 kg)  06/21/23 211 lb 3.2 oz (95.8 kg)    BP Readings from Last 3 Encounters:  03/23/24 (!) 150/80  08/15/23 111/71  06/21/23 123/70     Physical Exam   No results found for any visits on 03/23/24. {Show previous labs (optional):23779}    Assessment & Plan:    Routine Health Maintenance and Physical Exam  Immunization History  Administered Date(s) Administered   Influenza, Seasonal, Injecte, Preservative Fre 03/21/2023, 03/05/2024   Tdap 05/17/2020    Health Maintenance  Topic Date Due   COVID-19 Vaccine (1) 04/05/2024 (Originally 10/12/1982)   Hepatitis B Vaccines 19-59 Average Risk (1 of 3 - 19+ 3-dose series) 03/23/2025 (Originally 10/11/1996)   Colonoscopy  03/23/2025 (Originally 10/12/2022)   Mammogram  05/01/2025   Cervical Cancer Screening (HPV/Pap Cotest)  04/29/2028   DTaP/Tdap/Td (2 - Td or Tdap) 05/17/2030   Influenza Vaccine  Completed   Hepatitis C Screening  Completed   HIV Screening  Completed   Pneumococcal Vaccine  Aged Out   HPV VACCINES  Aged Out   Meningococcal B Vaccine  Aged Out    Discussed health benefits of physical activity, and encouraged her to engage in regular exercise appropriate for her age and condition.  Problem List Items Addressed This Visit   None  No follow-ups on file.     Annabella HERO Joesph, FNP

## 2024-03-24 LAB — CBC WITH DIFFERENTIAL/PLATELET
Basophils Absolute: 0 x10E3/uL (ref 0.0–0.2)
Basos: 1 %
EOS (ABSOLUTE): 0.4 x10E3/uL (ref 0.0–0.4)
Eos: 6 %
Hematocrit: 40.9 % (ref 34.0–46.6)
Hemoglobin: 13.2 g/dL (ref 11.1–15.9)
Immature Grans (Abs): 0 x10E3/uL (ref 0.0–0.1)
Immature Granulocytes: 0 %
Lymphocytes Absolute: 3.1 x10E3/uL (ref 0.7–3.1)
Lymphs: 48 %
MCH: 30.3 pg (ref 26.6–33.0)
MCHC: 32.3 g/dL (ref 31.5–35.7)
MCV: 94 fL (ref 79–97)
Monocytes Absolute: 0.3 x10E3/uL (ref 0.1–0.9)
Monocytes: 5 %
Neutrophils Absolute: 2.6 x10E3/uL (ref 1.4–7.0)
Neutrophils: 40 %
Platelets: 258 x10E3/uL (ref 150–450)
RBC: 4.35 x10E6/uL (ref 3.77–5.28)
RDW: 13.7 % (ref 11.7–15.4)
WBC: 6.5 x10E3/uL (ref 3.4–10.8)

## 2024-03-24 LAB — CMP14+EGFR
ALT: 26 IU/L (ref 0–32)
AST: 21 IU/L (ref 0–40)
Albumin: 4.7 g/dL (ref 3.9–4.9)
Alkaline Phosphatase: 80 IU/L (ref 41–116)
BUN/Creatinine Ratio: 24 — ABNORMAL HIGH (ref 9–23)
BUN: 13 mg/dL (ref 6–24)
Bilirubin Total: 0.2 mg/dL (ref 0.0–1.2)
CO2: 25 mmol/L (ref 20–29)
Calcium: 10 mg/dL (ref 8.7–10.2)
Chloride: 103 mmol/L (ref 96–106)
Creatinine, Ser: 0.55 mg/dL — ABNORMAL LOW (ref 0.57–1.00)
Globulin, Total: 2.4 g/dL (ref 1.5–4.5)
Glucose: 79 mg/dL (ref 70–99)
Potassium: 4.4 mmol/L (ref 3.5–5.2)
Sodium: 142 mmol/L (ref 134–144)
Total Protein: 7.1 g/dL (ref 6.0–8.5)
eGFR: 114 mL/min/1.73 (ref >59)

## 2024-03-24 LAB — LIPID PANEL
Chol/HDL Ratio: 4.3 ratio (ref 0.0–4.4)
Cholesterol, Total: 281 mg/dL — ABNORMAL HIGH (ref 100–199)
HDL: 66 mg/dL (ref >39)
LDL Chol Calc (NIH): 180 mg/dL — ABNORMAL HIGH (ref 0–99)
Triglycerides: 192 mg/dL — ABNORMAL HIGH (ref 0–149)
VLDL Cholesterol Cal: 35 mg/dL (ref 5–40)

## 2024-03-24 LAB — TSH: TSH: 1.03 u[IU]/mL (ref 0.450–4.500)

## 2024-03-25 ENCOUNTER — Ambulatory Visit: Payer: Self-pay | Admitting: Family Medicine

## 2025-03-29 ENCOUNTER — Encounter: Admitting: Family Medicine
